# Patient Record
Sex: Male | Born: 1945 | Race: White | Hispanic: No | Marital: Married | Smoking: Former smoker
Health system: Southern US, Community
[De-identification: ages and names within clinical notes are randomized; demographics above are authoritative.]

## PROBLEM LIST (undated history)

## (undated) DIAGNOSIS — F329 Major depressive disorder, single episode, unspecified: Secondary | ICD-10-CM

## (undated) DIAGNOSIS — K219 Gastro-esophageal reflux disease without esophagitis: Secondary | ICD-10-CM

## (undated) DIAGNOSIS — F32A Depression, unspecified: Secondary | ICD-10-CM

## (undated) HISTORY — DX: Gastro-esophageal reflux disease without esophagitis: K21.9

## (undated) HISTORY — DX: Depression, unspecified: F32.A

## (undated) HISTORY — DX: Major depressive disorder, single episode, unspecified: F32.9

## (undated) HISTORY — PX: NO PAST SURGERIES: SHX2092

---

## 2015-12-08 DIAGNOSIS — Z125 Encounter for screening for malignant neoplasm of prostate: Secondary | ICD-10-CM | POA: Diagnosis not present

## 2015-12-08 DIAGNOSIS — R6889 Other general symptoms and signs: Secondary | ICD-10-CM | POA: Diagnosis not present

## 2015-12-08 DIAGNOSIS — R7989 Other specified abnormal findings of blood chemistry: Secondary | ICD-10-CM | POA: Diagnosis not present

## 2015-12-08 DIAGNOSIS — M109 Gout, unspecified: Secondary | ICD-10-CM | POA: Diagnosis not present

## 2015-12-08 DIAGNOSIS — Z0000 Encounter for general adult medical examination without abnormal findings: Secondary | ICD-10-CM | POA: Diagnosis not present

## 2016-09-07 DIAGNOSIS — M19071 Primary osteoarthritis, right ankle and foot: Secondary | ICD-10-CM | POA: Diagnosis not present

## 2017-04-04 DIAGNOSIS — Z13220 Encounter for screening for lipoid disorders: Secondary | ICD-10-CM | POA: Diagnosis not present

## 2017-04-04 DIAGNOSIS — M109 Gout, unspecified: Secondary | ICD-10-CM | POA: Diagnosis not present

## 2017-04-04 DIAGNOSIS — Z23 Encounter for immunization: Secondary | ICD-10-CM | POA: Diagnosis not present

## 2017-04-04 DIAGNOSIS — M19071 Primary osteoarthritis, right ankle and foot: Secondary | ICD-10-CM | POA: Diagnosis not present

## 2017-04-04 DIAGNOSIS — Z1159 Encounter for screening for other viral diseases: Secondary | ICD-10-CM | POA: Diagnosis not present

## 2017-04-04 DIAGNOSIS — Z1211 Encounter for screening for malignant neoplasm of colon: Secondary | ICD-10-CM | POA: Diagnosis not present

## 2017-06-27 DIAGNOSIS — M109 Gout, unspecified: Secondary | ICD-10-CM | POA: Diagnosis not present

## 2017-07-04 DIAGNOSIS — R7309 Other abnormal glucose: Secondary | ICD-10-CM | POA: Diagnosis not present

## 2017-10-10 ENCOUNTER — Telehealth: Payer: Self-pay | Admitting: General Practice

## 2017-10-10 NOTE — Telephone Encounter (Signed)
Received records via fax from [facility] placed 16 pages in [facility] at front under "A"

## 2017-10-19 DIAGNOSIS — H2513 Age-related nuclear cataract, bilateral: Secondary | ICD-10-CM | POA: Diagnosis not present

## 2018-01-01 ENCOUNTER — Encounter: Payer: Self-pay | Admitting: Family Medicine

## 2018-01-01 ENCOUNTER — Ambulatory Visit (INDEPENDENT_AMBULATORY_CARE_PROVIDER_SITE_OTHER): Payer: PPO | Admitting: Family Medicine

## 2018-01-01 VITALS — BP 126/80 | HR 77 | Temp 98.7°F | Ht 65.0 in | Wt 180.0 lb

## 2018-01-01 DIAGNOSIS — J069 Acute upper respiratory infection, unspecified: Secondary | ICD-10-CM | POA: Diagnosis not present

## 2018-01-01 DIAGNOSIS — Z0000 Encounter for general adult medical examination without abnormal findings: Secondary | ICD-10-CM | POA: Diagnosis not present

## 2018-01-01 DIAGNOSIS — Z1211 Encounter for screening for malignant neoplasm of colon: Secondary | ICD-10-CM

## 2018-01-01 DIAGNOSIS — M109 Gout, unspecified: Secondary | ICD-10-CM

## 2018-01-01 HISTORY — DX: Acute upper respiratory infection, unspecified: J06.9

## 2018-01-01 HISTORY — DX: Encounter for screening for malignant neoplasm of colon: Z12.11

## 2018-01-01 HISTORY — DX: Gout, unspecified: M10.9

## 2018-01-01 MED ORDER — ASPIRIN EC 81 MG PO TBEC: 81.0000 mg | DELAYED_RELEASE_TABLET | Freq: Every day | ORAL | 1 refills | Status: DC

## 2018-01-01 MED ORDER — ALLOPURINOL 100 MG PO TABS: 100.0000 mg | ORAL_TABLET | Freq: Every day | ORAL | 0 refills | Status: DC

## 2018-01-01 MED ORDER — AZITHROMYCIN 250 MG PO TABS: ORAL_TABLET | ORAL | 0 refills | Status: DC

## 2018-01-01 NOTE — Progress Notes (Signed)
Subjective:  Patient ID: Gary Case, male    DOB: August 18, 1946  Age: 73 y.o. MRN: [id number]  CC: Establish Care   HPI Billie Intriago presents for establishment for evaluation of a URI.  He is well-known to me.  He has had a cold that seems to be getting better over the last 5 days.  His cough is improving.  There has been no fever or chills.  He has had a stuffy and runny nose with clear rhinorrhea.  He has not been using over-the-counter medicines there is been no wheezing.  He does not smoke or have an asthma history.  He needs a refill on his allopurinol.  His gout is well controlled with allopurinol.  He only uses the Indocin and colchicine on an as-needed basis.  He is never had a colonoscopy and we discussed that as well as his shingles vaccine.  He continues to work as a Scientist, physiological of large gland tracts.  Outpatient Medications Prior to Visit  Medication Sig Dispense Refill  . colchicine 0.6 MG tablet Take 1 tablet by mouth twice daily. Take for 7-10 days as needed for flares.    . indomethacin (INDOCIN) 25 MG capsule Take 25 mg by mouth 3 (three) times daily with meals.    Marland Kitchen allopurinol (ZYLOPRIM) 100 MG tablet Take 1 tablet by mouth daily.     No facility-administered medications prior to visit.     ROS Review of Systems  Constitutional: Negative for chills, fatigue and fever.  HENT: Positive for congestion, postnasal drip and rhinorrhea. Negative for sinus pressure and sinus pain.   Eyes: Negative for photophobia and visual disturbance.  Respiratory: Positive for cough. Negative for chest tightness, shortness of breath and wheezing.   Cardiovascular: Negative.   Gastrointestinal: Negative.   Endocrine: Negative for polyphagia and polyuria.  Musculoskeletal: Negative for arthralgias.  Skin: Negative.  Negative for rash.  Neurological: Negative for weakness and headaches.  Hematological: Negative.   Psychiatric/Behavioral: Negative.      Objective:  BP 126/80 (BP Location: Left Arm, Patient Position: Sitting, Cuff Size: Normal)   Pulse 77   Temp 98.7 F (37.1 C) (Oral)   Ht 5\' 5"  (1.651 m)   Wt 180 lb (81.6 kg)   SpO2 97%   BMI 29.95 kg/m   BP Readings from Last 3 Encounters:  01/01/18 126/80    Wt Readings from Last 3 Encounters:  01/01/18 180 lb (81.6 kg)    Physical Exam  Constitutional: He is oriented to person, place, and time. He appears well-developed and well-nourished. No distress.  HENT:  Head: Normocephalic and atraumatic.  Right Ear: External ear normal.  Left Ear: External ear normal.  Mouth/Throat: Oropharynx is clear and moist. No oropharyngeal exudate.  Eyes: Conjunctivae are normal. Pupils are equal, round, and reactive to light. Right eye exhibits no discharge. Left eye exhibits no discharge. No scleral icterus.  Neck: Neck supple. No JVD present. No tracheal deviation present. No thyromegaly present.  Cardiovascular: Normal rate, regular rhythm and normal heart sounds.  Pulmonary/Chest: Effort normal and breath sounds normal. No stridor. No respiratory distress. He has no wheezes. He has no rales.  Abdominal: Soft. Bowel sounds are normal.  Lymphadenopathy:    He has no cervical adenopathy.  Neurological: He is alert and oriented to person, place, and time.  Skin: Skin is warm and dry. No rash noted. He is not diaphoretic. No erythema.  Psychiatric: He has a normal mood and affect. His behavior is  normal.    No results found for: WBC, HGB, HCT, PLT, GLUCOSE, CHOL, TRIG, HDL, LDLDIRECT, LDLCALC, ALT, AST, NA, K, CL, CREATININE, BUN, CO2, TSH, PSA, INR, GLUF, HGBA1C, MICROALBUR  Patient was never admitted.  Assessment & Plan:   Eliecer was seen today for establish care.  Diagnoses and all orders for this visit:  Viral upper respiratory tract infection -     azithromycin (ZITHROMAX) 250 MG tablet; Please allow to hold.  Take 2 today and then one each day until  finished.  Gout, unspecified cause, unspecified chronicity, unspecified site -     allopurinol (ZYLOPRIM) 100 MG tablet; Take 1 tablet (100 mg total) by mouth daily.  Healthcare maintenance -     aspirin EC 81 MG tablet; Take 1 tablet (81 mg total) by mouth daily.   I have changed Chrissie Noa Dimarco's allopurinol. I am also having him start on azithromycin and aspirin EC. Additionally, I am having him maintain his indomethacin and colchicine.   Patient was given information today on Colo guard.  He will return in May for physical exam.  Refilled his allopurinol today.  He was given give given a prescription for Zithromax to use if his URI and does not continue to improve.  He will follow-up with me in a week if he is not 100% better.  Meds ordered this encounter  Medications  . allopurinol (ZYLOPRIM) 100 MG tablet    Sig: Take 1 tablet (100 mg total) by mouth daily.    Dispense:  100 tablet    Refill:  0  . azithromycin (ZITHROMAX) 250 MG tablet    Sig: Please allow to hold.  Take 2 today and then one each day until finished.    Dispense:  6 tablet    Refill:  0  . aspirin EC 81 MG tablet    Sig: Take 1 tablet (81 mg total) by mouth daily.    Dispense:  365 tablet    Refill:  1     Follow-up: Return in about 1 week (around 01/08/2018), or if symptoms worsen or fail to improve.  [name], MD

## 2018-01-02 ENCOUNTER — Encounter: Payer: Self-pay | Admitting: Family Medicine

## 2018-05-01 ENCOUNTER — Other Ambulatory Visit: Payer: Self-pay

## 2018-05-01 ENCOUNTER — Encounter: Payer: Self-pay | Admitting: Family Medicine

## 2018-05-01 ENCOUNTER — Ambulatory Visit (INDEPENDENT_AMBULATORY_CARE_PROVIDER_SITE_OTHER): Payer: PPO | Admitting: Family Medicine

## 2018-05-01 VITALS — BP 124/78 | HR 64 | Temp 98.7°F | Ht 65.0 in | Wt 181.5 lb

## 2018-05-01 DIAGNOSIS — E7800 Pure hypercholesterolemia, unspecified: Secondary | ICD-10-CM

## 2018-05-01 DIAGNOSIS — S41112A Laceration without foreign body of left upper arm, initial encounter: Secondary | ICD-10-CM | POA: Insufficient documentation

## 2018-05-01 DIAGNOSIS — R739 Hyperglycemia, unspecified: Secondary | ICD-10-CM | POA: Diagnosis not present

## 2018-05-01 DIAGNOSIS — Z23 Encounter for immunization: Secondary | ICD-10-CM

## 2018-05-01 DIAGNOSIS — M109 Gout, unspecified: Secondary | ICD-10-CM

## 2018-05-01 DIAGNOSIS — Z1211 Encounter for screening for malignant neoplasm of colon: Secondary | ICD-10-CM

## 2018-05-01 DIAGNOSIS — Z0000 Encounter for general adult medical examination without abnormal findings: Secondary | ICD-10-CM

## 2018-05-01 HISTORY — DX: Pure hypercholesterolemia, unspecified: E78.00

## 2018-05-01 HISTORY — DX: Laceration without foreign body of left upper arm, initial encounter: S41.112A

## 2018-05-01 HISTORY — DX: Hyperglycemia, unspecified: R73.9

## 2018-05-01 NOTE — Progress Notes (Signed)
Subjective:  Patient ID: Gary Case, male    DOB: 08-19-1946  Age: 72 y.o. MRN: [id number]  CC: Annual Exam   HPI Faruq Rosenberger presents for follow-up of his gout that is well controlled on low-dose allopurinol.  He uses colchicine and indomethacin's as needed for flares.  He has not had a colonoscopy.  He has been given a Cologuard kit but did not follow through with it.  He has been scheduled for colonoscopy some years ago but not did not actually have the exam.  He is having no changes in his bowel movements or blood in his stool.  Reminded him that Medicare may not cover the cost of screening past age 49.  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  His blood sugar has been elevated in the past but he has never been diagnosed with diabetes.  He has no frequent urination or weight loss.  No visual problems.  He does have a healing laceration of his left arm.  His urine flow is good.  He does experience nocturia 1-2 times per night but then he admits drinking fluids just before bedtime.  Outpatient Medications Prior to Visit  Medication Sig Dispense Refill  . aspirin EC 81 MG tablet Take 1 tablet (81 mg total) by mouth daily. 365 tablet 1  . colchicine 0.6 MG tablet Take 1 tablet by mouth twice daily. Take for 7-10 days as needed for flares.    . indomethacin (INDOCIN) 25 MG capsule Take 25 mg by mouth 3 (three) times daily with meals.    Marland Kitchen allopurinol (ZYLOPRIM) 100 MG tablet Take 1 tablet (100 mg total) by mouth daily. 100 tablet 0   No facility-administered medications prior to visit.     ROS Review of Systems  Constitutional: Negative for activity change, appetite change, chills, fatigue, fever and unexpected weight change.  HENT: Negative.   Eyes: Negative for photophobia and visual disturbance.  Respiratory: Negative.  Negative for cough and shortness of breath.   Cardiovascular: Negative for chest pain and palpitations.  Gastrointestinal: Negative for anal  bleeding, blood in stool, constipation and diarrhea.  Endocrine: Negative for polyphagia and polyuria.  Genitourinary: Negative for decreased urine volume, difficulty urinating, frequency and urgency.  Musculoskeletal: Negative for arthralgias and myalgias.  Skin: Negative for color change and pallor.  Allergic/Immunologic: Negative for immunocompromised state.  Neurological: Negative for weakness and headaches.  Hematological: Negative for adenopathy. Does not bruise/bleed easily.  Psychiatric/Behavioral: Negative for dysphoric mood. The patient is not nervous/anxious.     Objective:  BP 124/78   Pulse 64   Temp 98.7 F (37.1 C)   Ht _0  (1.651 m)   Wt 181 lb 8 oz (82.3 kg)   SpO2 98%   BMI 30.20 kg/m   BP Readings from Last 3 Encounters:  05/01/18 124/78  01/01/18 126/80    Wt Readings from Last 3 Encounters:  05/01/18 181 lb 8 oz (82.3 kg)  01/01/18 180 lb (81.6 kg)    Physical Exam  Constitutional: He is oriented to person, place, and time. He appears well-developed and well-nourished. No distress.  HENT:  Head: Normocephalic and atraumatic.  Right Ear: External ear normal.  Left Ear: External ear normal.  Mouth/Throat: Oropharynx is clear and moist. No oropharyngeal exudate.  Eyes: Pupils are equal, round, and reactive to light. Conjunctivae and EOM are normal. Right eye exhibits no discharge. Left eye exhibits no discharge. No scleral icterus.  Neck: Normal range of motion. Neck supple.  No JVD present. No tracheal deviation present. No thyromegaly present.  Cardiovascular: Normal rate, regular rhythm and normal heart sounds.  Pulmonary/Chest: Effort normal and breath sounds normal.  Abdominal: Soft. Bowel sounds are normal. He exhibits no distension and no mass. There is no tenderness. There is no rebound and no guarding.  Genitourinary: Rectum normal. Rectal exam shows no external hemorrhoid, no internal hemorrhoid, no fissure, no mass, no tenderness, anal tone  normal and guaiac negative stool. Prostate is not enlarged and not tender.  Musculoskeletal: He exhibits no edema or tenderness.  Lymphadenopathy:    He has no cervical adenopathy.  Neurological: He is alert and oriented to person, place, and time.  Skin: Skin is warm and dry. Capillary refill takes less than 2 seconds. No rash noted. He is not diaphoretic. No erythema. No pallor.  Psychiatric: He has a normal mood and affect. His behavior is normal.    No results found for: WBC, HGB, HCT, PLT, GLUCOSE, CHOL, TRIG, HDL, LDLDIRECT, LDLCALC, ALT, AST, NA, K, CL, CREATININE, BUN, CO2, TSH, PSA, INR, GLUF, HGBA1C, MICROALBUR  Patient was never admitted.  Assessment & Plan:   Sanuel was seen today for annual exam.  Diagnoses and all orders for this visit:  Gout, unspecified cause, unspecified chronicity, unspecified site -     CBC; Future -     Urinalysis, Routine w reflex microscopic; Future -     Uric acid; Future  Healthcare maintenance -     Comprehensive metabolic panel; Future -     PSA; Future  Laceration of left upper extremity, initial encounter -     Tdap vaccine greater than or equal to 7yo IM  Elevated cholesterol -     CBC; Future -     Comprehensive metabolic panel; Future -     Lipid panel; Future  Screen for colon cancer -     Ambulatory referral to Gastroenterology  Elevated blood sugar -     Comprehensive metabolic panel; Future -     Hemoglobin A1c; Future -     Urinalysis, Routine w reflex microscopic; Future  Need for vaccination against Streptococcus pneumoniae -     Pneumococcal polysaccharide vaccine 23-valent greater than or equal to 2yo subcutaneous/IM   I am having Sherrin Daisy maintain his indomethacin, colchicine, allopurinol, and aspirin EC.  No orders of the defined types were placed in this encounter.  Patient will return fasting in the morning for his blood work.  Routine wound care for his arm laceration.  Encouraged him to  maintain his currently healthy lifestyle that includes walking and riding his bicycle for distances.  Applauded him for finally agreeing to go for colonoscopy.  Follow-up: Return recommended follow up pends lab results.  Libby Maw, MD

## 2018-05-02 ENCOUNTER — Other Ambulatory Visit (INDEPENDENT_AMBULATORY_CARE_PROVIDER_SITE_OTHER): Payer: PPO

## 2018-05-02 DIAGNOSIS — M109 Gout, unspecified: Secondary | ICD-10-CM

## 2018-05-02 DIAGNOSIS — E7800 Pure hypercholesterolemia, unspecified: Secondary | ICD-10-CM

## 2018-05-02 DIAGNOSIS — R739 Hyperglycemia, unspecified: Secondary | ICD-10-CM

## 2018-05-02 DIAGNOSIS — Z0000 Encounter for general adult medical examination without abnormal findings: Secondary | ICD-10-CM | POA: Diagnosis not present

## 2018-05-02 LAB — URINALYSIS, ROUTINE W REFLEX MICROSCOPIC
BILIRUBIN URINE: NEGATIVE
Hgb urine dipstick: NEGATIVE
KETONES UR: NEGATIVE
LEUKOCYTES UA: NEGATIVE
NITRITE: NEGATIVE
Specific Gravity, Urine: 1.01 (ref 1.000–1.030)
Total Protein, Urine: NEGATIVE
UROBILINOGEN UA: 0.2 (ref 0.0–1.0)
Urine Glucose: NEGATIVE
pH: 6 (ref 5.0–8.0)

## 2018-05-02 LAB — COMPREHENSIVE METABOLIC PANEL
ALBUMIN: 4 g/dL (ref 3.5–5.2)
ALK PHOS: 88 U/L (ref 39–117)
ALT: 15 U/L (ref 0–53)
AST: 14 U/L (ref 0–37)
BILIRUBIN TOTAL: 0.8 mg/dL (ref 0.2–1.2)
BUN: 12 mg/dL (ref 6–23)
CO2: 30 mEq/L (ref 19–32)
Calcium: 9.4 mg/dL (ref 8.4–10.5)
Chloride: 103 mEq/L (ref 96–112)
Creatinine, Ser: 0.95 mg/dL (ref 0.40–1.50)
GFR: 82.34 mL/min (ref >60.00)
GLUCOSE: 115 mg/dL — AB (ref 70–99)
Potassium: 4.6 mEq/L (ref 3.5–5.1)
SODIUM: 140 meq/L (ref 135–145)
TOTAL PROTEIN: 6.8 g/dL (ref 6.0–8.3)

## 2018-05-02 LAB — LIPID PANEL
CHOLESTEROL: 167 mg/dL (ref 0–200)
HDL: 45.1 mg/dL (ref >39.00)
LDL Cholesterol: 100 mg/dL — ABNORMAL HIGH (ref 0–99)
NonHDL: 122.33
TRIGLYCERIDES: 113 mg/dL (ref 0.0–149.0)
Total CHOL/HDL Ratio: 4
VLDL: 22.6 mg/dL (ref 0.0–40.0)

## 2018-05-02 LAB — CBC
HEMATOCRIT: 44.6 % (ref 39.0–52.0)
HEMOGLOBIN: 15.2 g/dL (ref 13.0–17.0)
MCHC: 34.2 g/dL (ref 30.0–36.0)
MCV: 90.4 fl (ref 78.0–100.0)
Platelets: 268 10*3/uL (ref 150.0–400.0)
RBC: 4.94 Mil/uL (ref 4.22–5.81)
RDW: 13.6 % (ref 11.5–15.5)
WBC: 8 10*3/uL (ref 4.0–10.5)

## 2018-05-02 LAB — URIC ACID: Uric Acid, Serum: 6.6 mg/dL (ref 4.0–7.8)

## 2018-05-02 LAB — PSA: PSA: 0.91 ng/mL (ref 0.10–4.00)

## 2018-05-02 LAB — HEMOGLOBIN A1C: HEMOGLOBIN A1C: 5.9 % (ref 4.6–6.5)

## 2018-06-05 ENCOUNTER — Encounter: Payer: Self-pay | Admitting: Family Medicine

## 2018-07-10 ENCOUNTER — Telehealth: Payer: Self-pay | Admitting: Family Medicine

## 2018-07-10 ENCOUNTER — Other Ambulatory Visit: Payer: Self-pay | Admitting: *Deleted

## 2018-07-10 DIAGNOSIS — M109 Gout, unspecified: Secondary | ICD-10-CM

## 2018-07-10 MED ORDER — ALLOPURINOL 100 MG PO TABS: 100.0000 mg | ORAL_TABLET | Freq: Every day | ORAL | 2 refills | Status: DC

## 2018-07-10 NOTE — Telephone Encounter (Signed)
Rx refilled per protocol- CPE 05/01/18- labs normal- notes to continue. OK to Rf 1 year

## 2018-07-10 NOTE — Telephone Encounter (Signed)
Copied from CRM [id number]. Topic: Quick Communication - Rx Refill/Question >> Jul 10, 2018 12:51 PM Crist Infante wrote: Medication: allopurinol (ZYLOPRIM) 100 MG tablet Pt states [facility] has sent several faxes to the office for this refill. Pt states he requested on 8/21 and would like you to find out why we did not get.   [facility] [address] [place], [place] - [address] [phone] (Phone) [phone] (Fax)

## 2018-08-16 ENCOUNTER — Other Ambulatory Visit: Payer: Self-pay | Admitting: Family Medicine

## 2018-08-16 MED ORDER — INDOMETHACIN 25 MG PO CAPS: 25.0000 mg | ORAL_CAPSULE | Freq: Three times a day (TID) | ORAL | 0 refills | Status: DC

## 2018-08-16 NOTE — Telephone Encounter (Signed)
Requested Prescriptions  Pending Prescriptions Disp Refills  . indomethacin (INDOCIN) 25 MG capsule 90 capsule 0    Sig: Take 1 capsule (25 mg total) by mouth 3 (three) times daily with meals.     Analgesics:  NSAIDS Passed - 08/16/2018 12:31 PM      Passed - Cr in normal range and within 360 days    Creatinine, Ser  Date Value Ref Range Status  05/02/2018 0.95 0.40 - 1.50 mg/dL Final         Passed - HGB in normal range and within 360 days    Hemoglobin  Date Value Ref Range Status  05/02/2018 15.2 13.0 - 17.0 g/dL Final         Passed - Patient is not pregnant      Passed - Valid encounter within last 12 months    Recent Outpatient Visits          3 months ago Gout, unspecified cause, unspecified chronicity, unspecified site   [facility] [place], Talmadge Coventry, MD   7 months ago Viral upper respiratory tract infection   [facility], Talmadge Coventry, MD

## 2018-08-16 NOTE — Telephone Encounter (Signed)
Copied from CRM [id number]. Topic: Quick Communication - Rx Refill/Question >> Aug 16, 2018 12:24 PM Gerrianne Scale wrote: Medication: indomethacin (INDOCIN) 25 MG capsule  Has the patient contacted their pharmacy? Yes.  Pharmacy called (Agent: If no, request that the patient contact the pharmacy for the refill.) (Agent: If yes, when and what did the pharmacy advise?) they faxed over request twice 08-11-18 and yesterday  Preferred Pharmacy (with phone number or street name): [facility] [address] [place], [place] - [address] [phone] (Phone) [phone] (Fax)    Agent: Please be advised that RX refills may take up to 3 business days. We ask that you follow-up with your pharmacy.

## 2018-12-13 ENCOUNTER — Other Ambulatory Visit: Payer: Self-pay | Admitting: Family Medicine

## 2019-01-25 ENCOUNTER — Telehealth: Payer: Self-pay | Admitting: Family Medicine

## 2019-01-25 NOTE — Telephone Encounter (Signed)
I called and spoke with patient. He is aware that the Cologuard will be ordered. Form filled out & placed on MD's desk to be signed. Will fax once signed. Patient is aware that they will mail him the kit.

## 2019-01-25 NOTE — Telephone Encounter (Signed)
Okay to order cologuard?  

## 2019-01-25 NOTE — Telephone Encounter (Signed)
Copied from CRM [id number]. Topic: General - Other >> Jan 25, 2019 12:55 PM Leafy Ro wrote: Reason for CRM: pt is calling and does not want to have a colonoscopy. Pt would like to have cologuard test. Pt needs to have cologuard before he turns 73 on 03[birth year]2019

## 2019-01-25 NOTE — Telephone Encounter (Signed)
Okay 

## 2019-02-18 LAB — COLOGUARD

## 2019-02-28 DIAGNOSIS — Z1211 Encounter for screening for malignant neoplasm of colon: Secondary | ICD-10-CM | POA: Diagnosis not present

## 2019-02-28 DIAGNOSIS — Z1212 Encounter for screening for malignant neoplasm of rectum: Secondary | ICD-10-CM | POA: Diagnosis not present

## 2019-02-28 LAB — COLOGUARD: Cologuard: NEGATIVE

## 2019-03-05 ENCOUNTER — Encounter: Payer: Self-pay | Admitting: Family Medicine

## 2019-03-05 ENCOUNTER — Telehealth: Payer: Self-pay | Admitting: Family Medicine

## 2019-03-05 NOTE — Telephone Encounter (Signed)
I called and spoke with patient. I informed him that his cologuard results are negative & he verbalized understanding.

## 2019-04-15 ENCOUNTER — Other Ambulatory Visit: Payer: Self-pay | Admitting: Family Medicine

## 2019-04-15 DIAGNOSIS — M109 Gout, unspecified: Secondary | ICD-10-CM

## 2019-04-29 ENCOUNTER — Other Ambulatory Visit: Payer: Self-pay | Admitting: Family Medicine

## 2019-04-29 DIAGNOSIS — M109 Gout, unspecified: Secondary | ICD-10-CM

## 2019-04-29 MED ORDER — ALLOPURINOL 100 MG PO TABS: 100.0000 mg | ORAL_TABLET | Freq: Every day | ORAL | 3 refills | Status: DC

## 2019-04-29 NOTE — Telephone Encounter (Signed)
Rx sent in for 90 tabs with 3 refills.

## 2019-04-29 NOTE — Telephone Encounter (Signed)
Copied from [place] [id number]. Topic: Quick Communication - Rx Refill/Question >> Apr 29, 2019 10:29 AM Virl Axe D wrote: Medication: allopurinol (ZYLOPRIM) 100 MG tablet / Pt stated that the pharmacy told him they did not receive a new rx on 04/15/19. Would like to know if rx can be resent. Also pt is requesting rx be changed to 90 days with refills to cover 1 year because he has had multiple issues every time this rx needs to be filled. Please advise  Has the patient contacted their pharmacy? Yes.   (Agent: If no, request that the patient contact the pharmacy for the refill.) (Agent: If yes, when and what did the pharmacy advise?)  Preferred Pharmacy (with phone number or street name): [facility], [place] [phone] (Phone) [phone] (Fax)    Agent: Please be advised that RX refills may take up to 3 business days. We ask that you follow-up with your pharmacy.

## 2019-06-13 ENCOUNTER — Other Ambulatory Visit: Payer: Self-pay

## 2019-08-02 ENCOUNTER — Other Ambulatory Visit: Payer: Self-pay

## 2019-08-02 DIAGNOSIS — M109 Gout, unspecified: Secondary | ICD-10-CM

## 2019-08-02 MED ORDER — ALLOPURINOL 100 MG PO TABS: 100.0000 mg | ORAL_TABLET | Freq: Every day | ORAL | 3 refills | Status: DC

## 2019-09-03 ENCOUNTER — Other Ambulatory Visit: Payer: Self-pay | Admitting: Family Medicine

## 2019-09-03 ENCOUNTER — Telehealth: Payer: Self-pay

## 2019-09-03 NOTE — Telephone Encounter (Signed)
I called and spoke with pt. He has been notified that his Rx was declined. I advised him that the Rx was sent in this morning. I called and spoke with pharmacy. They are filling the Rx now, pt is aware.

## 2019-09-11 DIAGNOSIS — H2513 Age-related nuclear cataract, bilateral: Secondary | ICD-10-CM | POA: Diagnosis not present

## 2019-10-15 DIAGNOSIS — M5442 Lumbago with sciatica, left side: Secondary | ICD-10-CM | POA: Diagnosis not present

## 2019-10-15 DIAGNOSIS — M9904 Segmental and somatic dysfunction of sacral region: Secondary | ICD-10-CM | POA: Diagnosis not present

## 2019-10-15 DIAGNOSIS — M9903 Segmental and somatic dysfunction of lumbar region: Secondary | ICD-10-CM | POA: Diagnosis not present

## 2019-10-15 DIAGNOSIS — M62830 Muscle spasm of back: Secondary | ICD-10-CM | POA: Diagnosis not present

## 2019-10-21 DIAGNOSIS — M5442 Lumbago with sciatica, left side: Secondary | ICD-10-CM | POA: Diagnosis not present

## 2019-10-21 DIAGNOSIS — M9903 Segmental and somatic dysfunction of lumbar region: Secondary | ICD-10-CM | POA: Diagnosis not present

## 2019-10-21 DIAGNOSIS — M62830 Muscle spasm of back: Secondary | ICD-10-CM | POA: Diagnosis not present

## 2019-10-21 DIAGNOSIS — M9904 Segmental and somatic dysfunction of sacral region: Secondary | ICD-10-CM | POA: Diagnosis not present

## 2019-12-05 ENCOUNTER — Other Ambulatory Visit: Payer: Self-pay | Admitting: Family Medicine

## 2019-12-05 NOTE — Telephone Encounter (Signed)
WK-Is refill appropriate? Plz advise/thx dmf

## 2019-12-06 ENCOUNTER — Telehealth: Payer: Self-pay | Admitting: Family Medicine

## 2019-12-06 NOTE — Telephone Encounter (Signed)
Patient calling and requesting a 90 days supply not 60 days for indomethacin. Please call patient at [phone].

## 2019-12-06 NOTE — Telephone Encounter (Signed)
Spoke with patient regarding medication and refill, pt verbally understood he is currently over due for follow up on medication and he will need to be seen in the office before 90 day supply can be sent. Appointment scheduled for 12/10/19

## 2019-12-10 ENCOUNTER — Ambulatory Visit: Payer: PPO | Admitting: Family Medicine

## 2019-12-11 ENCOUNTER — Other Ambulatory Visit: Payer: Self-pay

## 2019-12-12 ENCOUNTER — Ambulatory Visit (INDEPENDENT_AMBULATORY_CARE_PROVIDER_SITE_OTHER): Payer: PPO | Admitting: Family Medicine

## 2019-12-12 ENCOUNTER — Encounter: Payer: Self-pay | Admitting: Family Medicine

## 2019-12-12 VITALS — BP 122/68 | HR 52 | Temp 97.1°F | Ht 65.0 in | Wt 179.2 lb

## 2019-12-12 DIAGNOSIS — M109 Gout, unspecified: Secondary | ICD-10-CM

## 2019-12-12 DIAGNOSIS — E7800 Pure hypercholesterolemia, unspecified: Secondary | ICD-10-CM

## 2019-12-12 LAB — LDL CHOLESTEROL, DIRECT: Direct LDL: 107 mg/dL

## 2019-12-12 LAB — COMPREHENSIVE METABOLIC PANEL
ALT: 14 U/L (ref 0–53)
AST: 16 U/L (ref 0–37)
Albumin: 4.2 g/dL (ref 3.5–5.2)
Alkaline Phosphatase: 77 U/L (ref 39–117)
BUN: 14 mg/dL (ref 6–23)
CO2: 32 mEq/L (ref 19–32)
Calcium: 9.5 mg/dL (ref 8.4–10.5)
Chloride: 105 mEq/L (ref 96–112)
Creatinine, Ser: 0.96 mg/dL (ref 0.40–1.50)
GFR: 76.21 mL/min (ref >60.00)
Glucose, Bld: 102 mg/dL — ABNORMAL HIGH (ref 70–99)
Potassium: 4.8 mEq/L (ref 3.5–5.1)
Sodium: 140 mEq/L (ref 135–145)
Total Bilirubin: 0.9 mg/dL (ref 0.2–1.2)
Total Protein: 6.6 g/dL (ref 6.0–8.3)

## 2019-12-12 LAB — URINALYSIS, ROUTINE W REFLEX MICROSCOPIC
Bilirubin Urine: NEGATIVE
Hgb urine dipstick: NEGATIVE
Ketones, ur: NEGATIVE
Leukocytes,Ua: NEGATIVE
Nitrite: NEGATIVE
RBC / HPF: NONE SEEN (ref 0–?)
Specific Gravity, Urine: 1.015 (ref 1.000–1.030)
Total Protein, Urine: NEGATIVE
Urine Glucose: NEGATIVE
Urobilinogen, UA: 0.2 (ref 0.0–1.0)
WBC, UA: NONE SEEN (ref 0–?)
pH: 6 (ref 5.0–8.0)

## 2019-12-12 LAB — CBC
HCT: 46.1 % (ref 39.0–52.0)
Hemoglobin: 15.9 g/dL (ref 13.0–17.0)
MCHC: 34.4 g/dL (ref 30.0–36.0)
MCV: 90 fl (ref 78.0–100.0)
Platelets: 247 10*3/uL (ref 150.0–400.0)
RBC: 5.12 Mil/uL (ref 4.22–5.81)
RDW: 13.1 % (ref 11.5–15.5)
WBC: 5.8 10*3/uL (ref 4.0–10.5)

## 2019-12-12 LAB — URIC ACID: Uric Acid, Serum: 5.8 mg/dL (ref 4.0–7.8)

## 2019-12-12 MED ORDER — COLCHICINE 0.6 MG PO TABS: 0.6000 mg | ORAL_TABLET | Freq: Two times a day (BID) | ORAL | 1 refills | Status: DC

## 2019-12-12 MED ORDER — ALLOPURINOL 100 MG PO TABS: 100.0000 mg | ORAL_TABLET | Freq: Every day | ORAL | 3 refills | Status: DC

## 2019-12-12 MED ORDER — INDOMETHACIN 25 MG PO CAPS: ORAL_CAPSULE | ORAL | 1 refills | Status: DC

## 2019-12-12 NOTE — Progress Notes (Signed)
Established Patient Office Visit  Subjective:  Patient ID: Gary Case, male    DOB: 19-Feb-1946  Age: 74 y.o. MRN: [id number]  CC:  Chief Complaint  Patient presents with  . Follow-up    refill/follow up on medications, no concerns     HPI Gary Case presents for follow-up of his gouty arthritis.  Continues to do well with the allopurinol and has had no issues taking it.  Denies any specific gouty flares.  He does have ongoing pain in multiple joints.  This is largely relieved when he takes 1 single 25 mg indomethacin nightly before bed.  He has tried Tylenol before without any specific relief.  Denies ongoing indigestion.  He occasionally takes a warm glass of water when there is indigestion and this is rare today.  He does not smoke.  Cholesterol is mildly elevated.  Past Medical History:  Diagnosis Date  . Depression   . GERD (gastroesophageal reflux disease)     History reviewed. No pertinent surgical history.  Family History  Problem Relation Age of Onset  . Arthritis Mother   . Cancer Mother   . Early death Mother   . Alcohol abuse Father   . Diabetes Father   . Hypertension Father   . Kidney disease Father     Social History   Socioeconomic History  . Marital status: Married    Spouse name: Not on file  . Number of children: Not on file  . Years of education: Not on file  . Highest education level: Not on file  Occupational History  . Not on file  Tobacco Use  . Smoking status: Former Research scientist (life sciences)  . Smokeless tobacco: Never Used  Substance and Sexual Activity  . Alcohol use: No  . Drug use: Not on file  . Sexual activity: Not on file  Other Topics Concern  . Not on file  Social History Narrative  . Not on file   Social Determinants of Health   Financial Resource Strain:   . Difficulty of Paying Living Expenses: Not on file  Food Insecurity:   . Worried About Charity fundraiser in the Last Year: Not on file  . Ran Out of Food in the Last  Year: Not on file  Transportation Needs:   . Lack of Transportation (Medical): Not on file  . Lack of Transportation (Non-Medical): Not on file  Physical Activity:   . Days of Exercise per Week: Not on file  . Minutes of Exercise per Session: Not on file  Stress:   . Feeling of Stress : Not on file  Social Connections:   . Frequency of Communication with Friends and Family: Not on file  . Frequency of Social Gatherings with Friends and Family: Not on file  . Attends Religious Services: Not on file  . Active Member of Clubs or Organizations: Not on file  . Attends Archivist Meetings: Not on file  . Marital Status: Not on file  Intimate Partner Violence:   . Fear of Current or Ex-Partner: Not on file  . Emotionally Abused: Not on file  . Physically Abused: Not on file  . Sexually Abused: Not on file    Outpatient Medications Prior to Visit  Medication Sig Dispense Refill  . aspirin EC 81 MG tablet Take 1 tablet (81 mg total) by mouth daily. 365 tablet 1  . allopurinol (ZYLOPRIM) 100 MG tablet Take 1 tablet (100 mg total) by mouth daily. 90 tablet 3  . indomethacin (  INDOCIN) 25 MG capsule Take 1 capsule (25 mg total) by mouth 3 (three) times daily as needed. 60 capsule 0  . colchicine 0.6 MG tablet Take 1 tablet by mouth twice daily. Take for 7-10 days as needed for flares.     No facility-administered medications prior to visit.    No Known Allergies  ROS Review of Systems  Constitutional: Negative.   HENT: Negative.   Eyes: Negative for photophobia and visual disturbance.  Respiratory: Negative.   Cardiovascular: Negative.   Gastrointestinal: Negative.   Endocrine: Negative for polyphagia.  Genitourinary: Negative for difficulty urinating, frequency and urgency.  Musculoskeletal: Positive for arthralgias.  Skin: Negative for pallor and rash.  Allergic/Immunologic: Negative for immunocompromised state.  Neurological: Negative for light-headedness and numbness.   Hematological: Does not bruise/bleed easily.  Psychiatric/Behavioral: Negative.       Objective:    Physical Exam  Constitutional: He is oriented to person, place, and time. He appears well-developed and well-nourished. No distress.  HENT:  Head: Normocephalic and atraumatic.  Right Ear: External ear normal.  Left Ear: External ear normal.  Eyes: Conjunctivae are normal. Right eye exhibits no discharge. Left eye exhibits no discharge. No scleral icterus.  Neck: No JVD present. No tracheal deviation present. No thyromegaly present.  Cardiovascular: Normal rate, regular rhythm and normal heart sounds.  Pulmonary/Chest: Effort normal and breath sounds normal. No stridor.  Abdominal: Bowel sounds are normal.  Lymphadenopathy:    He has no cervical adenopathy.  Neurological: He is alert and oriented to person, place, and time.  Skin: Skin is warm and dry. He is not diaphoretic.  Psychiatric: He has a normal mood and affect. His behavior is normal.    BP 122/68   Pulse (!) 52   Temp (!) 97.1 F (36.2 C) (Temporal)   Ht 5\' 5"  (1.651 m)   Wt 179 lb 3.2 oz (81.3 kg)   SpO2 99%   BMI 29.82 kg/m  Wt Readings from Last 3 Encounters:  12/12/19 179 lb 3.2 oz (81.3 kg)  05/01/18 181 lb 8 oz (82.3 kg)  01/01/18 180 lb (81.6 kg)     Health Maintenance Due  Topic Date Due  . INFLUENZA VACCINE  06/15/2019    There are no preventive care reminders to display for this patient.  No results found for: TSH Lab Results  Component Value Date   WBC 8.0 05/02/2018   HGB 15.2 05/02/2018   HCT 44.6 05/02/2018   MCV 90.4 05/02/2018   PLT 268.0 05/02/2018   Lab Results  Component Value Date   NA 140 05/02/2018   K 4.6 05/02/2018   CO2 30 05/02/2018   GLUCOSE 115 (H) 05/02/2018   BUN 12 05/02/2018   CREATININE 0.95 05/02/2018   BILITOT 0.8 05/02/2018   ALKPHOS 88 05/02/2018   AST 14 05/02/2018   ALT 15 05/02/2018   PROT 6.8 05/02/2018   ALBUMIN 4.0 05/02/2018   CALCIUM 9.4  05/02/2018   GFR 82.34 05/02/2018   Lab Results  Component Value Date   CHOL 167 05/02/2018   Lab Results  Component Value Date   HDL 45.10 05/02/2018   Lab Results  Component Value Date   LDLCALC 100 (H) 05/02/2018   Lab Results  Component Value Date   TRIG 113.0 05/02/2018   Lab Results  Component Value Date   CHOLHDL 4 05/02/2018   Lab Results  Component Value Date   HGBA1C 5.9 05/02/2018      Assessment & Plan:  Problem List Items Addressed This Visit      Musculoskeletal and Integument   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 allopurinol (ZYLOPRIM) 100 MG tablet    Sig: Take 1 tablet (100 mg total) by mouth daily.    Dispense:  90 tablet    Refill:  3  . colchicine 0.6 MG tablet    Sig: Take 1 tablet (0.6 mg total) by mouth 2 (two) times daily. . Take for 7-10 days as needed for flares.    Dispense:  30 tablet    Refill:  1  . indomethacin (INDOCIN) 25 MG capsule    Sig: Take one nightly    Dispense:  90 capsule    Refill:  1    Follow-up: Return in about 6 months (around 06/10/2020).   We will continue to monitor his renal function.  He will let me know if his indigestion becomes an ongoing daily issue.  He is at low risk for cardiovascular disease. [name], MD

## 2019-12-12 NOTE — Patient Instructions (Signed)
Gout  Gout is a condition that causes painful swelling of the joints. Gout is a type of inflammation of the joints (arthritis). This condition is caused by having too much uric acid in the body. Uric acid is a chemical that forms when the body breaks down substances called purines. Purines are important for building body proteins. When the body has too much uric acid, sharp crystals can form and build up inside the joints. This causes pain and swelling. Gout attacks can happen quickly and may be very painful (acute gout). Over time, the attacks can affect more joints and become more frequent (chronic gout). Gout can also cause uric acid to build up under the skin and inside the kidneys. What are the causes? This condition is caused by too much uric acid in your blood. This can happen because:  Your kidneys do not remove enough uric acid from your blood. This is the most common cause.  Your body makes too much uric acid. This can happen with some cancers and cancer treatments. It can also occur if your body is breaking down too many red blood cells (hemolytic anemia).  You eat too many foods that are high in purines. These foods include organ meats and some seafood. Alcohol, especially beer, is also high in purines. A gout attack may be triggered by trauma or stress. What increases the risk? You are more likely to develop this condition if you:  Have a family history of gout.  Are male and middle-aged.  Are male and have gone through menopause.  Are obese.  Frequently drink alcohol, especially beer.  Are dehydrated.  Lose weight too quickly.  Have an organ transplant.  Have lead poisoning.  Take certain medicines, including aspirin, cyclosporine, diuretics, levodopa, and niacin.  Have kidney disease.  Have a skin condition called psoriasis. What are the signs or symptoms? An attack of acute gout happens quickly. It usually occurs in just one joint. The most common place is  the big toe. Attacks often start at night. Other joints that may be affected include joints of the feet, ankle, knee, fingers, wrist, or elbow. Symptoms of this condition may include:  Severe pain.  Warmth.  Swelling.  Stiffness.  Tenderness. The affected joint may be very painful to touch.  Shiny, red, or purple skin.  Chills and fever. Chronic gout may cause symptoms more frequently. More joints may be involved. You may also have white or yellow lumps (tophi) on your hands or feet or in other areas near your joints. How is this diagnosed? This condition is diagnosed based on your symptoms, medical history, and physical exam. You may have tests, such as:  Blood tests to measure uric acid levels.  Removal of joint fluid with a thin needle (aspiration) to look for uric acid crystals.  X-rays to look for joint damage. How is this treated? Treatment for this condition has two phases: treating an acute attack and preventing future attacks. Acute gout treatment may include medicines to reduce pain and swelling, including:  NSAIDs.  Steroids. These are strong anti-inflammatory medicines that can be taken by mouth (orally) or injected into a joint.  Colchicine. This medicine relieves pain and swelling when it is taken soon after an attack. It can be given by mouth or through an IV. Preventive treatment may include:  Daily use of smaller doses of NSAIDs or colchicine.  Use of a medicine that reduces uric acid levels in your blood.  Changes to your diet. You may   need to see a dietitian about what to eat and drink to prevent gout. Follow these instructions at home: During a gout attack   If directed, put ice on the affected area: ? Put ice in a plastic bag. ? Place a towel between your skin and the bag. ? Leave the ice on for 20 minutes, 2-3 times a day.  Raise (elevate) the affected joint above the level of your heart as often as possible.  Rest the joint as much as possible.  If the affected joint is in your leg, you may be given crutches to use.  Follow instructions from your health care provider about eating or drinking restrictions. Avoiding future gout attacks  Follow a low-purine diet as told by your dietitian or health care provider. Avoid foods and drinks that are high in purines, including liver, kidney, anchovies, asparagus, herring, mushrooms, mussels, and beer.  Maintain a healthy weight or lose weight if you are overweight. If you want to lose weight, talk with your health care provider. It is important that you do not lose weight too quickly.  Start or maintain an exercise program as told by your health care provider. Eating and drinking  Drink enough fluids to keep your urine pale yellow.  If you drink alcohol: ? Limit how much you use to:  0-1 drink a day for women.  0-2 drinks a day for men. ? Be aware of how much alcohol is in your drink. In the [place] one drink equals one 12 oz bottle of beer (355 mL) one 5 oz glass of wine (148 mL), or one 1 oz glass of hard liquor (44 mL). General instructions  Take over-the-counter and prescription medicines only as told by your health care provider.  Do not drive or use heavy machinery while taking prescription pain medicine.  Return to your normal activities as told by your health care provider. Ask your health care provider what activities are safe for you.  Keep all follow-up visits as told by your health care provider. This is important. Contact a health care provider if you have:  Another gout attack.  Continuing symptoms of a gout attack after 10 days of treatment.  Side effects from your medicines.  Chills or a fever.  Burning pain when you urinate.  Pain in your lower back or belly. Get help right away if you:  Have severe or uncontrolled pain.  Cannot urinate. Summary  Gout is painful swelling of the joints caused by inflammation.  The most common site of pain is the big  toe, but it can affect other joints in the body.  Medicines and dietary changes can help to prevent and treat gout attacks. This information is not intended to replace advice given to you by your health care provider. Make sure you discuss any questions you have with your health care provider. Document Revised: 05/23/2018 Document Reviewed: 05/23/2018 Elsevier Patient Education  2020 [employer]  

## 2020-03-02 DIAGNOSIS — M62830 Muscle spasm of back: Secondary | ICD-10-CM | POA: Diagnosis not present

## 2020-03-02 DIAGNOSIS — M9903 Segmental and somatic dysfunction of lumbar region: Secondary | ICD-10-CM | POA: Diagnosis not present

## 2020-03-02 DIAGNOSIS — M9904 Segmental and somatic dysfunction of sacral region: Secondary | ICD-10-CM | POA: Diagnosis not present

## 2020-03-02 DIAGNOSIS — M5442 Lumbago with sciatica, left side: Secondary | ICD-10-CM | POA: Diagnosis not present

## 2020-03-05 DIAGNOSIS — M5442 Lumbago with sciatica, left side: Secondary | ICD-10-CM | POA: Diagnosis not present

## 2020-03-05 DIAGNOSIS — M9904 Segmental and somatic dysfunction of sacral region: Secondary | ICD-10-CM | POA: Diagnosis not present

## 2020-03-05 DIAGNOSIS — M62830 Muscle spasm of back: Secondary | ICD-10-CM | POA: Diagnosis not present

## 2020-03-05 DIAGNOSIS — M9903 Segmental and somatic dysfunction of lumbar region: Secondary | ICD-10-CM | POA: Diagnosis not present

## 2020-03-12 DIAGNOSIS — M5442 Lumbago with sciatica, left side: Secondary | ICD-10-CM | POA: Diagnosis not present

## 2020-03-12 DIAGNOSIS — M62830 Muscle spasm of back: Secondary | ICD-10-CM | POA: Diagnosis not present

## 2020-03-12 DIAGNOSIS — M9904 Segmental and somatic dysfunction of sacral region: Secondary | ICD-10-CM | POA: Diagnosis not present

## 2020-03-12 DIAGNOSIS — M9903 Segmental and somatic dysfunction of lumbar region: Secondary | ICD-10-CM | POA: Diagnosis not present

## 2020-06-20 ENCOUNTER — Other Ambulatory Visit: Payer: Self-pay | Admitting: Family Medicine

## 2020-06-20 DIAGNOSIS — M109 Gout, unspecified: Secondary | ICD-10-CM

## 2020-06-22 DIAGNOSIS — M5442 Lumbago with sciatica, left side: Secondary | ICD-10-CM | POA: Diagnosis not present

## 2020-06-22 DIAGNOSIS — M9903 Segmental and somatic dysfunction of lumbar region: Secondary | ICD-10-CM | POA: Diagnosis not present

## 2020-06-22 DIAGNOSIS — M9904 Segmental and somatic dysfunction of sacral region: Secondary | ICD-10-CM | POA: Diagnosis not present

## 2020-06-22 DIAGNOSIS — M62830 Muscle spasm of back: Secondary | ICD-10-CM | POA: Diagnosis not present

## 2020-07-25 DIAGNOSIS — I10 Essential (primary) hypertension: Secondary | ICD-10-CM | POA: Diagnosis not present

## 2020-07-25 DIAGNOSIS — R000 Tachycardia, unspecified: Secondary | ICD-10-CM | POA: Diagnosis not present

## 2020-07-25 DIAGNOSIS — I491 Atrial premature depolarization: Secondary | ICD-10-CM | POA: Diagnosis not present

## 2020-07-25 DIAGNOSIS — R001 Bradycardia, unspecified: Secondary | ICD-10-CM | POA: Diagnosis not present

## 2020-08-03 ENCOUNTER — Ambulatory Visit (INDEPENDENT_AMBULATORY_CARE_PROVIDER_SITE_OTHER): Payer: PPO | Admitting: Family Medicine

## 2020-08-03 ENCOUNTER — Encounter: Payer: Self-pay | Admitting: Family Medicine

## 2020-08-03 ENCOUNTER — Other Ambulatory Visit: Payer: Self-pay

## 2020-08-03 ENCOUNTER — Ambulatory Visit (INDEPENDENT_AMBULATORY_CARE_PROVIDER_SITE_OTHER): Payer: PPO

## 2020-08-03 VITALS — BP 124/76 | HR 57 | Temp 97.4°F | Ht 65.0 in | Wt 176.2 lb

## 2020-08-03 DIAGNOSIS — K219 Gastro-esophageal reflux disease without esophagitis: Secondary | ICD-10-CM

## 2020-08-03 DIAGNOSIS — E7800 Pure hypercholesterolemia, unspecified: Secondary | ICD-10-CM

## 2020-08-03 DIAGNOSIS — R0789 Other chest pain: Secondary | ICD-10-CM

## 2020-08-03 DIAGNOSIS — R05 Cough: Secondary | ICD-10-CM | POA: Diagnosis not present

## 2020-08-03 DIAGNOSIS — M109 Gout, unspecified: Secondary | ICD-10-CM

## 2020-08-03 DIAGNOSIS — R0602 Shortness of breath: Secondary | ICD-10-CM | POA: Diagnosis not present

## 2020-08-03 DIAGNOSIS — K2900 Acute gastritis without bleeding: Secondary | ICD-10-CM

## 2020-08-03 HISTORY — DX: Acute gastritis without bleeding: K29.00

## 2020-08-03 HISTORY — DX: Gastro-esophageal reflux disease without esophagitis: K21.9

## 2020-08-03 MED ORDER — PANTOPRAZOLE SODIUM 40 MG PO TBEC: 40.0000 mg | DELAYED_RELEASE_TABLET | Freq: Every day | ORAL | 3 refills | Status: DC

## 2020-08-03 MED ORDER — MELOXICAM 7.5 MG PO TABS: ORAL_TABLET | ORAL | 0 refills | Status: DC

## 2020-08-03 NOTE — Addendum Note (Signed)
Addended by: Varney Biles on: 08/03/2020 03:22 PM   Modules accepted: Orders

## 2020-08-03 NOTE — Progress Notes (Addendum)
Established Patient Office Visit  Subjective:  Patient ID: Gary Case, male    DOB: Apr 26, 1946  Age: 74 y.o. MRN: [id number]  CC:  Chief Complaint  Patient presents with  . Hospitalization Follow-up    acid reflux on 07/25/20 went to ED but left w/o being seen.    HPI Gary Case presents for follow-up of severe episode of indigestion accompanied by nausea diaphoresis shortness of breath and chest pain.  He was taken to emergency room by EMS but decided not to wait for treatment.  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  He also takes allopurinol and colchicine on a as needed basis.  Last uric acid was 5.8.  He has had a cough productive of occasional phlegm without fever or chills.  He denies hematuria, hematochezia or dark tarry stools.  He usually treats his indigestion with warm sip of water.  Last LDL cholesterol was 102.  He quit smoking 30 or 40 years ago.   Past Medical History:  Diagnosis Date  . Depression   . GERD (gastroesophageal reflux disease)     History reviewed. No pertinent surgical history.  Family History  Problem Relation Age of Onset  . Arthritis Mother   . Cancer Mother   . Early death Mother   . Alcohol abuse Father   . Diabetes Father   . Hypertension Father   . Kidney disease Father     Social History   Socioeconomic History  . Marital status: Married    Spouse name: Not on file  . Number of children: Not on file  . Years of education: Not on file  . Highest education level: Not on file  Occupational History  . Not on file  Tobacco Use  . Smoking status: Former Games developer  . Smokeless tobacco: Never Used  Substance and Sexual Activity  . Alcohol use: No  . Drug use: Not on file  . Sexual activity: Not on file  Other Topics Concern  . Not on file  Social History Narrative  . Not on file   Social Determinants of Health   Financial  Resource Strain:   . Difficulty of Paying Living Expenses: Not on file  Food Insecurity:   . Worried About Programme researcher, broadcasting/film/video in the Last Year: Not on file  . Ran Out of Food in the Last Year: Not on file  Transportation Needs:   . Lack of Transportation (Medical): Not on file  . Lack of Transportation (Non-Medical): Not on file  Physical Activity:   . Days of Exercise per Week: Not on file  . Minutes of Exercise per Session: Not on file  Stress:   . Feeling of Stress : Not on file  Social Connections:   . Frequency of Communication with Friends and Family: Not on file  . Frequency of Social Gatherings with Friends and Family: Not on file  . Attends Religious Services: Not on file  . Active Member of Clubs or Organizations: Not on file  . Attends Banker Meetings: Not on file  . Marital Status: Not on file  Intimate Partner Violence:   . Fear of Current or Ex-Partner: Not on file  . Emotionally Abused: Not on file  . Physically Abused: Not on file  . Sexually Abused: Not on file    Outpatient Medications Prior to Visit  Medication Sig Dispense Refill  . aspirin EC 81  MG tablet Take 1 tablet (81 mg total) by mouth daily. 365 tablet 1  . allopurinol (ZYLOPRIM) 100 MG tablet Take 1 tablet (100 mg total) by mouth daily. 90 tablet 3  . indomethacin (INDOCIN) 25 MG capsule TAKE 1 CAPSULE BY MOUTH AT NIGHT AS NEEDED 90 capsule 0  . colchicine 0.6 MG tablet Take 1 tablet (0.6 mg total) by mouth 2 (two) times daily. . Take for 7-10 days as needed for flares. (Patient not taking: Reported on 08/03/2020) 30 tablet 1   No facility-administered medications prior to visit.    No Known Allergies  ROS Review of Systems  Constitutional: Negative.  Negative for diaphoresis.  HENT: Negative.   Eyes: Negative for photophobia and visual disturbance.  Respiratory: Positive for cough. Negative for chest tightness, shortness of breath and wheezing.   Cardiovascular: Negative for  chest pain, palpitations and leg swelling.  Gastrointestinal: Negative for abdominal pain, anal bleeding, blood in stool, nausea and vomiting.  Endocrine: Negative for polyphagia and polyuria.  Genitourinary: Negative for flank pain, hematuria and urgency.  Musculoskeletal: Positive for arthralgias.  Allergic/Immunologic: Negative for immunocompromised state.  Neurological: Negative for tremors, speech difficulty, weakness and light-headedness.  Hematological: Does not bruise/bleed easily.  Psychiatric/Behavioral: Negative.       Objective:    Physical Exam Vitals and nursing note reviewed.  Constitutional:      General: He is not in acute distress.    Appearance: Normal appearance. He is not ill-appearing, toxic-appearing or diaphoretic.  HENT:     Head: Normocephalic and atraumatic.     Right Ear: External ear normal.     Left Ear: External ear normal.  Eyes:     General: No scleral icterus.       Right eye: No discharge.        Left eye: No discharge.     Conjunctiva/sclera: Conjunctivae normal.     Pupils: Pupils are equal, round, and reactive to light.  Cardiovascular:     Rate and Rhythm: Normal rate and regular rhythm.  Pulmonary:     Effort: Pulmonary effort is normal.     Breath sounds: Normal breath sounds.  Abdominal:     General: Abdomen is flat. Bowel sounds are normal. There is no distension.     Palpations: Abdomen is soft. There is no mass.     Tenderness: There is no abdominal tenderness. There is no guarding or rebound.     Hernia: No hernia is present.  Musculoskeletal:     Cervical back: Normal range of motion and neck supple. No rigidity or tenderness.     Right lower leg: No edema.     Left lower leg: No edema.  Lymphadenopathy:     Cervical: No cervical adenopathy.  Neurological:     General: No focal deficit present.     Mental Status: He is alert and oriented to person, place, and time.  Psychiatric:        Mood and Affect: Mood normal.         Behavior: Behavior normal.   The 10-year ASCVD risk score Denman George DC Jr., et al., 2013) is: 24.4%   Values used to calculate the score:     Age: 41 years     Sex: Male     Is Non-Hispanic African American: No     Diabetic: No     Tobacco smoker: No     Systolic Blood Pressure: 124 mmHg     Is BP treated: No  HDL Cholesterol: 44.6 mg/dL     Total Cholesterol: 161 mg/dL  BP 979/89   Pulse (!) 57   Temp (!) 97.4 F (36.3 C) (Temporal)   Ht 5\' 5"  (1.651 m)   Wt 176 lb 3.2 oz (79.9 kg)   SpO2 97%   BMI 29.32 kg/m  Wt Readings from Last 3 Encounters:  08/03/20 176 lb 3.2 oz (79.9 kg)  12/12/19 179 lb 3.2 oz (81.3 kg)  05/01/18 181 lb 8 oz (82.3 kg)     Health Maintenance Due  Topic Date Due  . COVID-19 Vaccine (1) Never done  . INFLUENZA VACCINE  Never done    There are no preventive care reminders to display for this patient.  No results found for: TSH Lab Results  Component Value Date   WBC 6.3 08/05/2020   HGB 15.4 08/05/2020   HCT 44.6 08/05/2020   MCV 90.4 08/05/2020   PLT 245.0 08/05/2020   Lab Results  Component Value Date   NA 142 08/05/2020   K 5.1 08/05/2020   CO2 31 08/05/2020   GLUCOSE 98 08/05/2020   BUN 14 08/05/2020   CREATININE 0.96 08/05/2020   BILITOT 0.8 08/05/2020   ALKPHOS 77 08/05/2020   AST 14 08/05/2020   ALT 13 08/05/2020   PROT 6.7 08/05/2020   ALBUMIN 4.0 08/05/2020   CALCIUM 9.3 08/05/2020   GFR 76.08 08/05/2020   Lab Results  Component Value Date   CHOL 161 08/05/2020   Lab Results  Component Value Date   HDL 44.60 08/05/2020   Lab Results  Component Value Date   LDLCALC 97 08/05/2020   Lab Results  Component Value Date   TRIG 98.0 08/05/2020   Lab Results  Component Value Date   CHOLHDL 4 08/05/2020   Lab Results  Component Value Date   HGBA1C 5.9 05/02/2018      Assessment & Plan:   Problem List Items Addressed This Visit      Digestive   Gastroesophageal reflux disease   Relevant Medications    pantoprazole (PROTONIX) 40 MG tablet   Other Relevant Orders   Ambulatory referral to Gastroenterology   DG Chest 2 View (Completed)   Acute gastritis   Relevant Medications   pantoprazole (PROTONIX) 40 MG tablet   Other Relevant Orders   Ambulatory referral to Gastroenterology   DG Chest 2 View (Completed)     Other   Gout - Primary   Relevant Medications   meloxicam (MOBIC) 7.5 MG tablet   allopurinol (ZYLOPRIM) 100 MG tablet   colchicine 0.6 MG tablet   Other Relevant Orders   Uric acid (Completed)   Urinalysis, Routine w reflex microscopic (Completed)   CBC (Completed)   Comprehensive metabolic panel (Completed)   Elevated cholesterol   Relevant Orders   Comprehensive metabolic panel (Completed)   Lipid panel (Completed)    Other Visit Diagnoses    Other chest pain       Relevant Orders   Ambulatory referral to Cardiology      Meds ordered this encounter  Medications  . pantoprazole (PROTONIX) 40 MG tablet    Sig: Take 1 tablet (40 mg total) by mouth daily.    Dispense:  30 tablet    Refill:  3  . meloxicam (MOBIC) 7.5 MG tablet    Sig: Take one with food daily ONLY AS NEEDED.    Dispense:  30 tablet    Refill:  0  . allopurinol (ZYLOPRIM) 100 MG tablet  Sig: Take 2 tablets (200 mg total) by mouth daily.    Dispense:  180 tablet    Refill:  1  . colchicine 0.6 MG tablet    Sig: Take 1 tablet (0.6 mg total) by mouth daily.    Dispense:  90 tablet    Refill:  0    Follow-up: Return in about 6 weeks (around 09/14/2020), or RETURN FASTING FOR BLOODWORK..   Have increased allopurinol to 200 mg daily and will take colchicine daily with this for the first 3 months. [name], MD

## 2020-08-04 ENCOUNTER — Encounter: Payer: Self-pay | Admitting: Gastroenterology

## 2020-08-05 ENCOUNTER — Other Ambulatory Visit: Payer: Self-pay

## 2020-08-05 ENCOUNTER — Other Ambulatory Visit (INDEPENDENT_AMBULATORY_CARE_PROVIDER_SITE_OTHER): Payer: PPO

## 2020-08-05 DIAGNOSIS — E7800 Pure hypercholesterolemia, unspecified: Secondary | ICD-10-CM

## 2020-08-05 DIAGNOSIS — M109 Gout, unspecified: Secondary | ICD-10-CM

## 2020-08-05 LAB — LIPID PANEL
Cholesterol: 161 mg/dL (ref 0–200)
HDL: 44.6 mg/dL (ref >39.00)
LDL Cholesterol: 97 mg/dL (ref 0–99)
NonHDL: 116.16
Total CHOL/HDL Ratio: 4
Triglycerides: 98 mg/dL (ref 0.0–149.0)
VLDL: 19.6 mg/dL (ref 0.0–40.0)

## 2020-08-05 LAB — URINALYSIS, ROUTINE W REFLEX MICROSCOPIC
Bilirubin Urine: NEGATIVE
Hgb urine dipstick: NEGATIVE
Ketones, ur: NEGATIVE
Leukocytes,Ua: NEGATIVE
Nitrite: NEGATIVE
RBC / HPF: NONE SEEN (ref 0–?)
Specific Gravity, Urine: 1.02 (ref 1.000–1.030)
Total Protein, Urine: NEGATIVE
Urine Glucose: NEGATIVE
Urobilinogen, UA: 0.2 (ref 0.0–1.0)
pH: 6 (ref 5.0–8.0)

## 2020-08-05 LAB — CBC
HCT: 44.6 % (ref 39.0–52.0)
Hemoglobin: 15.4 g/dL (ref 13.0–17.0)
MCHC: 34.4 g/dL (ref 30.0–36.0)
MCV: 90.4 fl (ref 78.0–100.0)
Platelets: 245 10*3/uL (ref 150.0–400.0)
RBC: 4.93 Mil/uL (ref 4.22–5.81)
RDW: 13.2 % (ref 11.5–15.5)
WBC: 6.3 10*3/uL (ref 4.0–10.5)

## 2020-08-05 LAB — COMPREHENSIVE METABOLIC PANEL
ALT: 13 U/L (ref 0–53)
AST: 14 U/L (ref 0–37)
Albumin: 4 g/dL (ref 3.5–5.2)
Alkaline Phosphatase: 77 U/L (ref 39–117)
BUN: 14 mg/dL (ref 6–23)
CO2: 31 mEq/L (ref 19–32)
Calcium: 9.3 mg/dL (ref 8.4–10.5)
Chloride: 106 mEq/L (ref 96–112)
Creatinine, Ser: 0.96 mg/dL (ref 0.40–1.50)
GFR: 76.08 mL/min (ref >60.00)
Glucose, Bld: 98 mg/dL (ref 70–99)
Potassium: 5.1 mEq/L (ref 3.5–5.1)
Sodium: 142 mEq/L (ref 135–145)
Total Bilirubin: 0.8 mg/dL (ref 0.2–1.2)
Total Protein: 6.7 g/dL (ref 6.0–8.3)

## 2020-08-05 LAB — URIC ACID: Uric Acid, Serum: 6 mg/dL (ref 4.0–7.8)

## 2020-08-06 MED ORDER — ALLOPURINOL 100 MG PO TABS: 200.0000 mg | ORAL_TABLET | Freq: Every day | ORAL | 1 refills | Status: DC

## 2020-08-06 MED ORDER — COLCHICINE 0.6 MG PO TABS: 0.6000 mg | ORAL_TABLET | Freq: Every day | ORAL | 0 refills | Status: DC

## 2020-08-06 NOTE — Addendum Note (Signed)
Addended by: Andrez Grime on: 08/06/2020 07:56 AM   Modules accepted: Orders

## 2020-08-24 ENCOUNTER — Other Ambulatory Visit: Payer: Self-pay

## 2020-08-24 DIAGNOSIS — K219 Gastro-esophageal reflux disease without esophagitis: Secondary | ICD-10-CM | POA: Insufficient documentation

## 2020-08-24 DIAGNOSIS — F32A Depression, unspecified: Secondary | ICD-10-CM | POA: Insufficient documentation

## 2020-08-25 ENCOUNTER — Other Ambulatory Visit: Payer: Self-pay

## 2020-08-25 ENCOUNTER — Ambulatory Visit: Payer: PPO | Admitting: Cardiology

## 2020-08-25 ENCOUNTER — Encounter: Payer: Self-pay | Admitting: Cardiology

## 2020-08-25 DIAGNOSIS — R0609 Other forms of dyspnea: Secondary | ICD-10-CM | POA: Insufficient documentation

## 2020-08-25 DIAGNOSIS — R0789 Other chest pain: Secondary | ICD-10-CM | POA: Diagnosis not present

## 2020-08-25 DIAGNOSIS — R0600 Dyspnea, unspecified: Secondary | ICD-10-CM

## 2020-08-25 NOTE — Progress Notes (Signed)
Cardiology Office Note:    Date:  08/25/2020   ID:  Gary Case, DOB 17-Jun-1946, MRN [id number]  PCP:  [name], MD  Cardiologist:  Garwin Brothers, MD   Referring MD: [name],*    ASSESSMENT:    1. Chest discomfort   2. DOE (dyspnea on exertion)    PLAN:    In order of problems listed above:  1. Primary prevention stressed with the patient. Importance of compliance with diet medication stressed any vocalized understanding. He has excellent effort tolerance and I encouraged him to continue his exercise. I reviewed his lipids and they are fine also. 2. Chest discomfort: Patient denies chest pain. He had an episode of what he feels is gastritis and acidity. I reassured him about my findings today. 3. Dyspnea: Again this appears to be fairly benign in view of his good effort tolerance of medical management. He is not keen on any further evaluation on this and I agree with his wishes. 4. Cardiac murmur: Echocardiogram will be done to assess murmur on auscultation. 5. Coronary risk stratification: Calcium CT score was at twice to the patient and benefits were explained and is agreeable. 6. Patient will be seen in follow-up appointment in 6 months or earlier if the patient has any concerns    Medication Adjustments/Labs and Tests Ordered: Current medicines are reviewed at length with the patient today.  Concerns regarding medicines are outlined above.  No orders of the defined types were placed in this encounter.  No orders of the defined types were placed in this encounter.    History of Present Illness:    Gary Case is a 74 y.o. male who is being seen today for the evaluation of chest discomfort at the request of Julias, Mould,*. Patient is a pleasant 74 year old male. He has past medical history that is not much significant. He was at a social gathering and developed acidity he refers to this. He says it was more like a  gastritis situation. He denies chest pain. Some chest discomfort. He had excessive salivation at that time. He is an active gentleman and walks about 2 miles on a regular basis without any symptoms. He has some shortness of breath at times. At the time of my evaluation, the patient is alert awake oriented and in no distress.  Past Medical History:  Diagnosis Date  . Acute gastritis 08/03/2020  . Depression   . Elevated blood sugar 05/01/2018  . Elevated cholesterol 05/01/2018  . Gastroesophageal reflux disease 08/03/2020  . GERD (gastroesophageal reflux disease)   . Gout 01/01/2018  . Laceration of left upper extremity 05/01/2018  . Screen for colon cancer 01/01/2018  . Viral upper respiratory tract infection 01/01/2018    Past Surgical History:  Procedure Laterality Date  . NO PAST SURGERIES      Current Medications: Current Meds  Medication Sig  . allopurinol (ZYLOPRIM) 100 MG tablet Take 2 tablets (200 mg total) by mouth daily.  Marland Kitchen aspirin EC 81 MG tablet Take 1 tablet (81 mg total) by mouth daily.  . colchicine 0.6 MG tablet Take 1 tablet (0.6 mg total) by mouth daily.  . meloxicam (MOBIC) 7.5 MG tablet Take one with food daily ONLY AS NEEDED.  Marland Kitchen pantoprazole (PROTONIX) 40 MG tablet Take 1 tablet (40 mg total) by mouth daily.     Allergies:   Patient has no known allergies.   Social History   Socioeconomic History  . Marital status: Married  Spouse name: Not on file  . Number of children: Not on file  . Years of education: Not on file  . Highest education level: Not on file  Occupational History  . Not on file  Tobacco Use  . Smoking status: Former Games developer  . Smokeless tobacco: Never Used  Substance and Sexual Activity  . Alcohol use: No  . Drug use: Not on file  . Sexual activity: Not on file  Other Topics Concern  . Not on file  Social History Narrative  . Not on file   Social Determinants of Health   Financial Resource Strain:   . Difficulty of Paying Living  Expenses: Not on file  Food Insecurity:   . Worried About Programme researcher, broadcasting/film/video in the Last Year: Not on file  . Ran Out of Food in the Last Year: Not on file  Transportation Needs:   . Lack of Transportation (Medical): Not on file  . Lack of Transportation (Non-Medical): Not on file  Physical Activity:   . Days of Exercise per Week: Not on file  . Minutes of Exercise per Session: Not on file  Stress:   . Feeling of Stress : Not on file  Social Connections:   . Frequency of Communication with Friends and Family: Not on file  . Frequency of Social Gatherings with Friends and Family: Not on file  . Attends Religious Services: Not on file  . Active Member of Clubs or Organizations: Not on file  . Attends Banker Meetings: Not on file  . Marital Status: Not on file     Family History: The patient's family history includes Alcohol abuse in his father; Arthritis in his mother; Cancer in his mother; Diabetes in his father; Early death in his mother; Hypertension in his father; Kidney disease in his father.  ROS:   Please see the history of present illness.    All other systems reviewed and are negative.  EKGs/Labs/Other Studies Reviewed:    The following studies were reviewed today: EKG reveals sinus rhythm and nonspecific ST-T changes.   Recent Labs: 08/05/2020: ALT 13; BUN 14; Creatinine, Ser 0.96; Hemoglobin 15.4; Platelets 245.0; Potassium 5.1; Sodium 142  Recent Lipid Panel    Component Value Date/Time   CHOL 161 08/05/2020 0812   TRIG 98.0 08/05/2020 0812   HDL 44.60 08/05/2020 0812   CHOLHDL 4 08/05/2020 0812   VLDL 19.6 08/05/2020 0812   LDLCALC 97 08/05/2020 0812   LDLDIRECT 107.0 12/12/2019 1036    Physical Exam:    VS:  BP 128/78   Pulse (!) 55   Ht 5\' 7"  (1.702 m)   Wt 178 lb (80.7 kg)   SpO2 98%   BMI 27.88 kg/m     Wt Readings from Last 3 Encounters:  08/25/20 178 lb (80.7 kg)  08/03/20 176 lb 3.2 oz (79.9 kg)  12/12/19 179 lb 3.2 oz (81.3  kg)     GEN: Patient is in no acute distress HEENT: Normal NECK: No JVD; No carotid bruits LYMPHATICS: No lymphadenopathy CARDIAC: S1 S2 regular, 2/6 systolic murmur at the apex. RESPIRATORY:  Clear to auscultation without rales, wheezing or rhonchi  ABDOMEN: Soft, non-tender, non-distended MUSCULOSKELETAL:  No edema; No deformity  SKIN: Warm and dry NEUROLOGIC:  Alert and oriented x 3 PSYCHIATRIC:  Normal affect    Signed, 12/14/19, MD  08/25/2020 11:36 AM    [facility]

## 2020-08-25 NOTE — Patient Instructions (Signed)
Medication Instructions:  No medication changes. *If you need a refill on your cardiac medications before your next appointment, please call your pharmacy*   Lab Work: None ordered If you have labs (blood work) drawn today and your tests are completely normal, you will receive your results only by: Marland Kitchen MyChart Message (if you have MyChart) OR . A paper copy in the mail If you have any lab test that is abnormal or we need to change your treatment, we will call you to review the results.   Testing/Procedures: Your physician has requested that you have an echocardiogram. Echocardiography is a painless test that uses sound waves to create images of your heart. It provides your doctor with information about the size and shape of your heart and how well your heart's chambers and valves are working. This procedure takes approximately one hour. There are no restrictions for this procedure.  We will order CT coronary calcium score $150  Please call [phone] to schedule   [facility]  [address]  [place], [place] [address]    Follow-Up: At [facility], you and your health needs are our priority.  As part of our continuing mission to provide you with exceptional heart care, we have created designated Provider Care Teams.  These Care Teams include your primary Cardiologist (physician) and Advanced Practice Providers (APPs -  Physician Assistants and Nurse Practitioners) who all work together to provide you with the care you need, when you need it.  We recommend signing up for the patient portal called "MyChart".  Sign up information is provided on this After Visit Summary.  MyChart is used to connect with patients for Virtual Visits (Telemedicine).  Patients are able to view lab/test results, encounter notes, upcoming appointments, etc.  Non-urgent messages can be sent to your provider as well.   To learn more about what you can do with MyChart, go to [email or web address].      Your next appointment:   4 month(s)  The format for your next appointment:   In Person  Provider:   Belva Crome, MD   Other Instructions  Coronary Calcium Scan A coronary calcium scan is an imaging test used to look for deposits of plaque in the inner lining of the blood vessels of the heart (coronary arteries). Plaque is made up of calcium, protein, and fatty substances. These deposits of plaque can partly clog and narrow the coronary arteries without producing any symptoms or warning signs. This puts a person at risk for a heart attack. This test is recommended for people who are at moderate risk for heart disease. The test can find plaque deposits before symptoms develop. Tell a health care provider about:  Any allergies you have.  All medicines you are taking, including vitamins, herbs, eye drops, creams, and over-the-counter medicines.  Any problems you or family members have had with anesthetic medicines.  Any blood disorders you have.  Any surgeries you have had.  Any medical conditions you have.  Whether you are pregnant or may be pregnant. What are the risks? Generally, this is a safe procedure. However, problems may occur, including:  Harm to a pregnant woman and her unborn baby. This test involves the use of radiation. Radiation exposure can be dangerous to a pregnant woman and her unborn baby. If you are pregnant or think you may be pregnant, you should not have this procedure done.  Slight increase in the risk of cancer. This is because of the radiation involved  in the test. What happens before the procedure? Ask your health care provider for any specific instructions on how to prepare for this procedure. You may be asked to avoid products that contain caffeine, tobacco, or nicotine for 4 hours before the procedure. What happens during the procedure?   You will undress and remove any jewelry from your neck or chest.  You will put on a hospital  gown.  Sticky electrodes will be placed on your chest. The electrodes will be connected to an electrocardiogram (ECG) machine to record a tracing of the electrical activity of your heart.  You will lie down on a curved bed that is attached to the CT scanner.  You may be given medicine to slow down your heart rate so that clear pictures can be created.  You will be moved into the CT scanner, and the CT scanner will take pictures of your heart. During this time, you will be asked to lie still and hold your breath for 2-3 seconds at a time while each picture of your heart is being taken. The procedure may vary among health care providers and hospitals. What happens after the procedure?  You can get dressed.  You can return to your normal activities.  It is up to you to get the results of your procedure. Ask your health care provider, or the department that is doing the procedure, when your results will be ready. Summary  A coronary calcium scan is an imaging test used to look for deposits of plaque in the inner lining of the blood vessels of the heart (coronary arteries). Plaque is made up of calcium, protein, and fatty substances.  Generally, this is a safe procedure. Tell your health care provider if you are pregnant or may be pregnant.  Ask your health care provider for any specific instructions on how to prepare for this procedure.  A CT scanner will take pictures of your heart.  You can return to your normal activities after the scan is done. This information is not intended to replace advice given to you by your health care provider. Make sure you discuss any questions you have with your health care provider. Document Revised: 05/21/2019 Document Reviewed: 05/21/2019 Elsevier Patient Education  2020 [employer].   Echocardiogram An echocardiogram is a procedure that uses painless sound waves (ultrasound) to produce an image of the heart. Images from an echocardiogram can  provide important information about:  Signs of coronary artery disease (CAD).  Aneurysm detection. An aneurysm is a weak or damaged part of an artery wall that bulges out from the normal force of blood pumping through the body.  Heart size and shape. Changes in the size or shape of the heart can be associated with certain conditions, including heart failure, aneurysm, and CAD.  Heart muscle function.  Heart valve function.  Signs of a past heart attack.  Fluid buildup around the heart.  Thickening of the heart muscle.  A tumor or infectious growth around the heart valves. Tell a health care provider about:  Any allergies you have.  All medicines you are taking, including vitamins, herbs, eye drops, creams, and over-the-counter medicines.  Any blood disorders you have.  Any surgeries you have had.  Any medical conditions you have.  Whether you are pregnant or may be pregnant. What are the risks? Generally, this is a safe procedure. However, problems may occur, including:  Allergic reaction to dye (contrast) that may be used during the procedure. What happens before the  procedure? No specific preparation is needed. You may eat and drink normally. What happens during the procedure?   An IV tube may be inserted into one of your veins.  You may receive contrast through this tube. A contrast is an injection that improves the quality of the pictures from your heart.  A gel will be applied to your chest.  A wand-like tool (transducer) will be moved over your chest. The gel will help to transmit the sound waves from the transducer.  The sound waves will harmlessly bounce off of your heart to allow the heart images to be captured in real-time motion. The images will be recorded on a computer. The procedure may vary among health care providers and hospitals. What happens after the procedure?  You may return to your normal, everyday life, including diet, activities, and  medicines, unless your health care provider tells you not to do that. Summary  An echocardiogram is a procedure that uses painless sound waves (ultrasound) to produce an image of the heart.  Images from an echocardiogram can provide important information about the size and shape of your heart, heart muscle function, heart valve function, and fluid buildup around your heart.  You do not need to do anything to prepare before this procedure. You may eat and drink normally.  After the echocardiogram is completed, you may return to your normal, everyday life, unless your health care provider tells you not to do that. This information is not intended to replace advice given to you by your health care provider. Make sure you discuss any questions you have with your health care provider. Document Revised: 02/21/2019 Document Reviewed: 12/03/2016 Elsevier Patient Education  2020 [employer].

## 2020-09-09 ENCOUNTER — Ambulatory Visit (HOSPITAL_BASED_OUTPATIENT_CLINIC_OR_DEPARTMENT_OTHER): Payer: PPO

## 2020-09-10 ENCOUNTER — Ambulatory Visit (INDEPENDENT_AMBULATORY_CARE_PROVIDER_SITE_OTHER)
Admission: RE | Admit: 2020-09-10 | Discharge: 2020-09-10 | Disposition: A | Discharge status: Home / Self Care | Payer: Self-pay | Source: Ambulatory Visit | Attending: Cardiology | Admitting: Cardiology

## 2020-09-10 ENCOUNTER — Other Ambulatory Visit: Payer: Self-pay

## 2020-09-10 DIAGNOSIS — R0789 Other chest pain: Secondary | ICD-10-CM

## 2020-09-17 ENCOUNTER — Ambulatory Visit (INDEPENDENT_AMBULATORY_CARE_PROVIDER_SITE_OTHER): Payer: PPO

## 2020-09-17 VITALS — Ht 65.0 in | Wt 178.0 lb

## 2020-09-17 DIAGNOSIS — Z0000 Encounter for general adult medical examination without abnormal findings: Secondary | ICD-10-CM

## 2020-09-17 NOTE — Patient Instructions (Signed)
Mr. Gary Case , Thank you for taking time to complete your Medicare Wellness Visit. I appreciate your ongoing commitment to your health goals. Please review the following plan we discussed and let me know if I can assist you in the future.   Screening recommendations/referrals: Colonoscopy: No longer required Recommended yearly ophthalmology/optometry visit for glaucoma screening and checkup Recommended yearly dental visit for hygiene and checkup  Vaccinations: Influenza vaccine: Declined Pneumococcal vaccine: Completed vaccines Tdap vaccine: Up to date- Due-05/01/2028 Shingles vaccine: Discuss with pharmacy  Covid-19: Completed vaccines  Advanced directives: Please bring a copy for your chart once completed  Conditions/risks identified: See problem list  Next appointment: Follow up in one year for your annual wellness visit.   Preventive Care 76 Years and Older, Male Preventive care refers to lifestyle choices and visits with your health care provider that can promote health and wellness. What does preventive care include?  A yearly physical exam. This is also called an annual well check.  Dental exams once or twice a year.  Routine eye exams. Ask your health care provider how often you should have your eyes checked.  Personal lifestyle choices, including:  Daily care of your teeth and gums.  Regular physical activity.  Eating a healthy diet.  Avoiding tobacco and drug use.  Limiting alcohol use.  Practicing safe sex.  Taking low doses of aspirin every day.  Taking vitamin and mineral supplements as recommended by your health care provider. What happens during an annual well check? The services and screenings done by your health care provider during your annual well check will depend on your age, overall health, lifestyle risk factors, and family history of disease. Counseling  Your health care provider may ask you questions about your:  Alcohol use.  Tobacco  use.  Drug use.  Emotional well-being.  Home and relationship well-being.  Sexual activity.  Eating habits.  History of falls.  Memory and ability to understand (cognition).  Work and work Astronomer. Screening  You may have the following tests or measurements:  Height, weight, and BMI.  Blood pressure.  Lipid and cholesterol levels. These may be checked every 5 years, or more frequently if you are over 46 years old.  Skin check.  Lung cancer screening. You may have this screening every year starting at age 76 if you have a 30-pack-year history of smoking and currently smoke or have quit within the past 15 years.  Fecal occult blood test (FOBT) of the stool. You may have this test every year starting at age 76.  Flexible sigmoidoscopy or colonoscopy. You may have a sigmoidoscopy every 5 years or a colonoscopy every 10 years starting at age 76.  Prostate cancer screening. Recommendations will vary depending on your family history and other risks.  Hepatitis C blood test.  Hepatitis B blood test.  Sexually transmitted disease (STD) testing.  Diabetes screening. This is done by checking your blood sugar (glucose) after you have not eaten for a while (fasting). You may have this done every 1-3 years.  Abdominal aortic aneurysm (AAA) screening. You may need this if you are a current or former smoker.  Osteoporosis. You may be screened starting at age 76 if you are at high risk. Talk with your health care provider about your test results, treatment options, and if necessary, the need for more tests. Vaccines  Your health care provider may recommend certain vaccines, such as:  Influenza vaccine. This is recommended every year.  Tetanus, diphtheria, and acellular pertussis (Tdap,  Td) vaccine. You may need a Td booster every 10 years.  Zoster vaccine. You may need this after age 76.  Pneumococcal 13-valent conjugate (PCV13) vaccine. One dose is recommended after age  25.  Pneumococcal polysaccharide (PPSV23) vaccine. One dose is recommended after age 35. Talk to your health care provider about which screenings and vaccines you need and how often you need them. This information is not intended to replace advice given to you by your health care provider. Make sure you discuss any questions you have with your health care provider. Document Released: 11/27/2015 Document Revised: 07/20/2016 Document Reviewed: 09/01/2015 Elsevier Interactive Patient Education  2017 [place] Prevention in the Home Falls can cause injuries. They can happen to people of all ages. There are many things you can do to make your home safe and to help prevent falls. What can I do on the outside of my home?  Regularly fix the edges of walkways and driveways and fix any cracks.  Remove anything that might make you trip as you walk through a door, such as a raised step or threshold.  Trim any bushes or trees on the path to your home.  Use bright outdoor lighting.  Clear any walking paths of anything that might make someone trip, such as rocks or tools.  Regularly check to see if handrails are loose or broken. Make sure that both sides of any steps have handrails.  Any raised decks and porches should have guardrails on the edges.  Have any leaves, snow, or ice cleared regularly.  Use sand or salt on walking paths during winter.  Clean up any spills in your garage right away. This includes oil or grease spills. What can I do in the bathroom?  Use night lights.  Install grab bars by the toilet and in the tub and shower. Do not use towel bars as grab bars.  Use non-skid mats or decals in the tub or shower.  If you need to sit down in the shower, use a plastic, non-slip stool.  Keep the floor dry. Clean up any water that spills on the floor as soon as it happens.  Remove soap buildup in the tub or shower regularly.  Attach bath mats securely with double-sided  non-slip rug tape.  Do not have throw rugs and other things on the floor that can make you trip. What can I do in the bedroom?  Use night lights.  Make sure that you have a light by your bed that is easy to reach.  Do not use any sheets or blankets that are too big for your bed. They should not hang down onto the floor.  Have a firm chair that has side arms. You can use this for support while you get dressed.  Do not have throw rugs and other things on the floor that can make you trip. What can I do in the kitchen?  Clean up any spills right away.  Avoid walking on wet floors.  Keep items that you use a lot in easy-to-reach places.  If you need to reach something above you, use a strong step stool that has a grab bar.  Keep electrical cords out of the way.  Do not use floor polish or wax that makes floors slippery. If you must use wax, use non-skid floor wax.  Do not have throw rugs and other things on the floor that can make you trip. What can I do with my stairs?  Do not  leave any items on the stairs.  Make sure that there are handrails on both sides of the stairs and use them. Fix handrails that are broken or loose. Make sure that handrails are as long as the stairways.  Check any carpeting to make sure that it is firmly attached to the stairs. Fix any carpet that is loose or worn.  Avoid having throw rugs at the top or bottom of the stairs. If you do have throw rugs, attach them to the floor with carpet tape.  Make sure that you have a light switch at the top of the stairs and the bottom of the stairs. If you do not have them, ask someone to add them for you. What else can I do to help prevent falls?  Wear shoes that:  Do not have high heels.  Have rubber bottoms.  Are comfortable and fit you well.  Are closed at the toe. Do not wear sandals.  If you use a stepladder:  Make sure that it is fully opened. Do not climb a closed stepladder.  Make sure that both  sides of the stepladder are locked into place.  Ask someone to hold it for you, if possible.  Clearly mark and make sure that you can see:  Any grab bars or handrails.  First and last steps.  Where the edge of each step is.  Use tools that help you move around (mobility aids) if they are needed. These include:  Canes.  Walkers.  Scooters.  Crutches.  Turn on the lights when you go into a dark area. Replace any light bulbs as soon as they burn out.  Set up your furniture so you have a clear path. Avoid moving your furniture around.  If any of your floors are uneven, fix them.  If there are any pets around you, be aware of where they are.  Review your medicines with your doctor. Some medicines can make you feel dizzy. This can increase your chance of falling. Ask your doctor what other things that you can do to help prevent falls. This information is not intended to replace advice given to you by your health care provider. Make sure you discuss any questions you have with your health care provider. Document Released: 08/27/2009 Document Revised: 04/07/2016 Document Reviewed: 12/05/2014 Elsevier Interactive Patient Education  2017 [employer].

## 2020-09-17 NOTE — Progress Notes (Signed)
Subjective:   Gary Case is a 74 y.o. male who presents for Medicare Annual/Subsequent preventive examination.  I connected with Emanuelle today by telephone and verified that I am speaking with the correct person using two identifiers. Location patient: home Location provider: work Persons participating in the virtual visit: patient, Engineer, civil (consulting).    I discussed the limitations, risks, security and privacy concerns of performing an evaluation and management service by telephone and the availability of in person appointments. I also discussed with the patient that there may be a patient responsible charge related to this service. The patient expressed understanding and verbally consented to this telephonic visit.    Interactive audio and video telecommunications were attempted between this provider and patient, however failed, due to patient having technical difficulties OR patient did not have access to video capability.  We continued and completed visit with audio only.  Some vital signs may be absent or patient reported.     Review of Systems     Cardiac Risk Factors include: advanced age (>71men, >61 women);male gender;dyslipidemia     Objective:    Today's Vitals   09/17/20 1548  Weight: 178 lb (80.7 kg)  Height: 5\' 5"  (1.651 m)   Body mass index is 29.62 kg/m.  Advanced Directives 09/17/2020  Does Patient Have a Medical Advance Directive? No  Would patient like information on creating a medical advance directive? No - Patient declined    Current Medications (verified) Outpatient Encounter Medications as of 09/17/2020  Medication Sig  . allopurinol (ZYLOPRIM) 100 MG tablet Take 2 tablets (200 mg total) by mouth daily.  Marland Kitchen aspirin EC 81 MG tablet Take 1 tablet (81 mg total) by mouth daily.  . colchicine 0.6 MG tablet Take 1 tablet (0.6 mg total) by mouth daily.  . meloxicam (MOBIC) 7.5 MG tablet Take one with food daily ONLY AS NEEDED.  Marland Kitchen pantoprazole (PROTONIX) 40 MG  tablet Take 1 tablet (40 mg total) by mouth daily.   No facility-administered encounter medications on file as of 09/17/2020.    Allergies (verified) Patient has no known allergies.   History: Past Medical History:  Diagnosis Date  . Acute gastritis 08/03/2020  . Depression   . Elevated blood sugar 05/01/2018  . Elevated cholesterol 05/01/2018  . Gastroesophageal reflux disease 08/03/2020  . GERD (gastroesophageal reflux disease)   . Gout 01/01/2018  . Laceration of left upper extremity 05/01/2018  . Screen for colon cancer 01/01/2018  . Viral upper respiratory tract infection 01/01/2018   Past Surgical History:  Procedure Laterality Date  . NO PAST SURGERIES     Family History  Problem Relation Age of Onset  . Arthritis Mother   . Cancer Mother   . Early death Mother   . Alcohol abuse Father   . Diabetes Father   . Hypertension Father   . Kidney disease Father    Social History   Socioeconomic History  . Marital status: Divorced    Spouse name: Not on file  . Number of children: Not on file  . Years of education: Not on file  . Highest education level: Not on file  Occupational History  . Not on file  Tobacco Use  . Smoking status: Former Games developer  . Smokeless tobacco: Never Used  Substance and Sexual Activity  . Alcohol use: No  . Drug use: Not on file  . Sexual activity: Not on file  Other Topics Concern  . Not on file  Social History Narrative  . Not  on file   Social Determinants of Health   Financial Resource Strain: Low Risk   . Difficulty of Paying Living Expenses: Not hard at all  Food Insecurity: No Food Insecurity  . Worried About Programme researcher, broadcasting/film/video in the Last Year: Never true  . Ran Out of Food in the Last Year: Never true  Transportation Needs: No Transportation Needs  . Lack of Transportation (Medical): No  . Lack of Transportation (Non-Medical): No  Physical Activity: Sufficiently Active  . Days of Exercise per Week: 5 days  . Minutes of  Exercise per Session: 30 min  Stress: No Stress Concern Present  . Feeling of Stress : Not at all  Social Connections: Moderately Isolated  . Frequency of Communication with Friends and Family: More than three times a week  . Frequency of Social Gatherings with Friends and Family: Once a week  . Attends Religious Services: More than 4 times per year  . Active Member of Clubs or Organizations: No  . Attends Banker Meetings: Never  . Marital Status: Divorced    Tobacco Counseling Counseling given: Not Answered   Clinical Intake:  Pre-visit preparation completed: Yes  Pain : No/denies pain     Nutritional Status: BMI 25 -29 Overweight Nutritional Risks: None Diabetes: No  How often do you need to have someone help you when you read instructions, pamphlets, or other written materials from your doctor or pharmacy?: 1 - Never What is the last grade level you completed in school?: Bachelor's Degree  Diabetic?No  Interpreter Needed?: No  Information entered by :: Thomasenia Sales LPN   Activities of Daily Living In your present state of health, do you have any difficulty performing the following activities: 09/17/2020  Hearing? N  Vision? N  Difficulty concentrating or making decisions? N  Walking or climbing stairs? N  Dressing or bathing? N  Doing errands, shopping? N  Preparing Food and eating ? N  Using the Toilet? N  In the past six months, have you accidently leaked urine? N  Do you have problems with loss of bowel control? N  Managing your Medications? N  Managing your Finances? N  Housekeeping or managing your Housekeeping? N  Some recent data might be hidden    Patient Care Team: [name], MD as PCP - General (Family Medicine)  Indicate any recent Medical Services you may have received from other than [facility] providers in the past year (date may be approximate).     Assessment:   This is a routine wellness examination for  Chrissie Noa.  Hearing/Vision screen  Hearing Screening   125Hz  250Hz  500Hz  1000Hz  2000Hz  3000Hz  4000Hz  6000Hz  8000Hz   Right ear:           Left ear:           Comments: No issues  Vision Screening Comments: Wears glasses Last eye exam-[facility]  Dietary issues and exercise activities discussed: Current Exercise Habits: Home exercise routine, Type of exercise: walking, Time (Minutes): 30, Frequency (Times/Week): 5, Weekly Exercise (Minutes/Week): 150, Intensity: Mild, Exercise limited by: None identified  Goals   None    Depression Screen PHQ 2/9 Scores 09/17/2020 12/12/2019  PHQ - 2 Score 0 0    Fall Risk Fall Risk  09/17/2020 12/12/2019 06/13/2019  Falls in the past year? 0 0 0  Comment - - Emmi Telephone Survey: data to providers prior to load  Number falls in past yr: 0 - -  Injury with Fall? 0 - -  Follow up Falls prevention discussed - -    Any stairs in or around the home? Yes If so, are there any without handrail s? No  Home free of loose throw rugs in walkways, pet beds, electrical cords, etc? Yes  Adequate lighting in your home to reduce risk of falls? Yes   ASSISTIVE DEVICES UTILIZED TO PREVENT FALLS:  Life alert? No  Use of a cane, walker or w/c? No  Grab bars in the bathroom? Yes  Shower chair or bench in shower? No  Elevated toilet seat or a handicapped toilet? No   TIMED UP AND GO:  Was the test performed? No . Phone visit   Cognitive Function:No cognitive impairment noted        Immunizations Immunization History  Administered Date(s) Administered  . Pneumococcal Conjugate-13 04/04/2017  . Pneumococcal Polysaccharide-23 05/01/2018  . Tdap 05/01/2018    TDAP status: Up to date   Flu Vaccine status: Declined, Education has been provided regarding the importance of this vaccine but patient still declined. Advised may receive this vaccine at local pharmacy or Health Dept. Aware to provide a copy of the vaccination record if obtained from local  pharmacy or Health Dept. Verbalized acceptance and understanding.   Pneumococcal vaccine status: Up to date   Covid-19 vaccine status: Completed vaccines  Qualifies for Shingles Vaccine? Yes   Zostavax completed No   Shingrix Completed?: No.    Education has been provided regarding the importance of this vaccine. Patient has been advised to call insurance company to determine out of pocket expense if they have not yet received this vaccine. Advised may also receive vaccine at local pharmacy or Health Dept. Verbalized acceptance and understanding.  Screening Tests Health Maintenance  Topic Date Due  . COVID-19 Vaccine (1) 10/03/2020 (Originally 03/21/1958)  . INFLUENZA VACCINE  02/11/2021 (Originally 06/14/2020)  . TETANUS/TDAP  05/01/2028  . Hepatitis C Screening  Completed  . PNA vac Low Risk Adult  Completed    Health Maintenance  There are no preventive care reminders to display for this patient.  Colorectal cancer screening: No longer required.   Lung Cancer Screening: (Low Dose CT Chest recommended if Age 2-80 years, 30 pack-year currently smoking OR have quit w/in 15years.) does not qualify.     Additional Screening:  Hepatitis C Screening: Completed 04/04/2017  Vision Screening: Recommended annual ophthalmology exams for early detection of glaucoma and other disorders of the eye. Is the patient up to date with their annual eye exam?  Yes  Who is the provider or what is the name of the office in which the patient attends annual eye exams? [facility]  Dental Screening: Recommended annual dental exams for proper oral hygiene  Community Resource Referral / Chronic Care Management: CRR required this visit?  No   CCM required this visit?  No      Plan:     I have personally reviewed and noted the following in the patient's chart:   . Medical and social history . Use of alcohol, tobacco or illicit drugs  . Current medications and supplements . Functional ability  and status . Nutritional status . Physical activity . Advanced directives . List of other physicians . Hospitalizations, surgeries, and ER visits in previous 12 months . Vitals . Screenings to include cognitive, depression, and falls . Referrals and appointments  In addition, I have reviewed and discussed with patient certain preventive protocols, quality metrics, and best practice recommendations. A written personalized care plan for preventive  services as well as general preventive health recommendations were provided to patient.   Due to this being a telephonic visit, the after visit summary with patients personalized plan was offered to patient via mail or my-chart.  Patient would like to access on my-chart.   Roanna Raider, LPN   [id number]  Nurse Health Advisor  Nurse Notes: None

## 2020-09-23 ENCOUNTER — Ambulatory Visit: Payer: PPO | Admitting: Gastroenterology

## 2020-10-21 ENCOUNTER — Telehealth: Payer: Self-pay | Admitting: Family Medicine

## 2020-10-21 NOTE — Progress Notes (Signed)
  Chronic Care Management   Note  10/21/2020 Name: Adalberto Metzgar MRN: [id number] DOB: 03/30/1946  Nicklas Mcsweeney is a 74 y.o. year old male who is a primary care patient of Burlie, Cajamarca, MD. I reached out to [name] by phone today in response to a referral sent by Mr. Areli Siegman's PCP, [name], MD.   Mr. Jellison was given information about Chronic Care Management services today including:  1. CCM service includes personalized support from designated clinical staff supervised by his physician, including individualized plan of care and coordination with other care providers 2. 24/7 contact phone numbers for assistance for urgent and routine care needs. 3. Service will only be billed when office clinical staff spend 20 minutes or more in a month to coordinate care. 4. Only one practitioner may furnish and bill the service in a calendar month. 5. The patient may stop CCM services at any time (effective at the end of the month) by phone call to the office staff.   Patient agreed to services and verbal consent obtained.   Follow up plan:   Carley Perdue [facility] Scheduler

## 2020-11-02 ENCOUNTER — Other Ambulatory Visit: Payer: Self-pay | Admitting: Family Medicine

## 2020-11-02 DIAGNOSIS — M109 Gout, unspecified: Secondary | ICD-10-CM

## 2020-11-03 NOTE — Telephone Encounter (Signed)
Please see message and advise.  Thank you. Last OV 09/17/20 Last fill 08/03/20  #30/0

## 2020-11-14 ENCOUNTER — Other Ambulatory Visit: Payer: Self-pay | Admitting: Family Medicine

## 2020-11-14 DIAGNOSIS — M109 Gout, unspecified: Secondary | ICD-10-CM

## 2020-11-16 ENCOUNTER — Telehealth: Payer: Self-pay

## 2020-11-16 NOTE — Progress Notes (Signed)
    Chronic Care Management Pharmacy Assistant   Name: Gary Case  MRN: [id number] DOB: Jan 06, 1946  Reason for Encounter: Medication Review/Initial question for initial visit with clinical pharmacist.   PCP : [name], MD  Allergies:  No Known Allergies  Medications: Outpatient Encounter Medications as of 11/16/2020  Medication Sig  . allopurinol (ZYLOPRIM) 100 MG tablet Take 2 tablets (200 mg total) by mouth daily.  Marland Kitchen aspirin EC 81 MG tablet Take 1 tablet (81 mg total) by mouth daily.  . colchicine 0.6 MG tablet TAKE  TABLET BY MOUTH ONCE DAILY  . meloxicam (MOBIC) 7.5 MG tablet TAKE 1 TABLET BY MOUTH ONCE DAILY WITH FOOD ONLY AS NEEDED  . pantoprazole (PROTONIX) 40 MG tablet Take 1 tablet (40 mg total) by mouth daily.   No facility-administered encounter medications on file as of 11/16/2020.    Current Diagnosis: Patient Active Problem List   Diagnosis Date Noted  . Chest discomfort 08/25/2020  . DOE (dyspnea on exertion) 08/25/2020  . Depression   . GERD (gastroesophageal reflux disease)   . Gastroesophageal reflux disease 08/03/2020  . Acute gastritis 08/03/2020  . Laceration of left upper extremity 05/01/2018  . Elevated cholesterol 05/01/2018  . Elevated blood sugar 05/01/2018  . Viral upper respiratory tract infection 01/01/2018  . Gout 01/01/2018  . Screen for colon cancer 01/01/2018    Goals Addressed   None    Have you seen any other providers since your last visit? no Any changes in your medications or health? no Any side effects from any medications? no Do you have an symptoms or problems not managed by your medications? no Any concerns about your health right now? no Has your provider asked that you check blood pressure, blood sugar, or follow special diet at home? no   Patient states he does not check his blood pressure or blood sugar at home, but he does try to watch what he eats.  Patient states he cooks at home rarely uses salt.  Do  you get any type of exercise on a regular basis? Yes  Patient reports he walks a couple of miles through out the week, but not every day maybe every other day. Can you think of a goal you would like to reach for your health?   Patient states he will think of a goal, and inform the clinical pharmacists at his visit.  Do you have any problems getting your medications? no Is there anything that you would like to discuss during the appointment?   Patient states he can not think of anything at this time to discuss.  Please bring medications and supplements to appointment  Follow-Up:  Pharmacist Review   Everlean Cherry Clinical Pharmacist Assistant [phone]

## 2020-11-17 NOTE — Chronic Care Management (AMB) (Addendum)
Chronic Care Management Pharmacy  Name: Gary Case  MRN: [id number] DOB: Jul 09, 1946   Chief Complaint/ HPI  Gary Case,  75 y.o. , male presents for his Initial CCM visit with the clinical pharmacist via telephone.  PCP : [name], MD Patient Care Team: [name], MD as PCP - General (Family Medicine) Gaspar Cola, [facility] as Pharmacist (Pharmacist)  Patient's chronic conditions include: GERD, Depression, Osteoarthritis and Gout   Office Visits: 08/03/20: Patient presented to Dr. Doreene Burke for hospital follow-up. Inomethacin stopped. Colchicine decreased to 0.6 mg daily. Allopurinol 200 mg daily started. Pantoprazole 40 mg daily and meloxicam 7.5mg  daily started.   Consult Visit: 08/25/20: Patient presented to Dr. Tomie China (Cardiology) for chest discomfort. CT Calcium score negative.  07/25/20: Patient presented to ED. Patient with severe indigestion + nausea.   Objective: No Known Allergies  Medications: Outpatient Encounter Medications as of 11/24/2020  Medication Sig  . allopurinol (ZYLOPRIM) 100 MG tablet Take 2 tablets (200 mg total) by mouth daily. (Patient taking differently: Take 100 mg by mouth daily.)  . aspirin EC 81 MG tablet Take 1 tablet (81 mg total) by mouth daily.  . colchicine 0.6 MG tablet TAKE  TABLET BY MOUTH ONCE DAILY  . meloxicam (MOBIC) 7.5 MG tablet TAKE 1 TABLET BY MOUTH ONCE DAILY WITH FOOD ONLY AS NEEDED  . pantoprazole (PROTONIX) 40 MG tablet Take 1 tablet (40 mg total) by mouth daily.   No facility-administered encounter medications on file as of 11/24/2020.    Wt Readings from Last 3 Encounters:  09/17/20 178 lb (80.7 kg)  08/25/20 178 lb (80.7 kg)  08/03/20 176 lb 3.2 oz (79.9 kg)    Lab Results  Component Value Date   CREATININE 0.96 08/05/2020   BUN 14 08/05/2020   GFR 76.08 08/05/2020   NA 142 08/05/2020   K 5.1 08/05/2020   CALCIUM 9.3 08/05/2020   CO2 31 08/05/2020     Current  Diagnosis/Assessment:  SDOH Interventions   Flowsheet Row Most Recent Value  SDOH Interventions   Financial Strain Interventions Intervention Not Indicated  Transportation Interventions Intervention Not Indicated      Goals Addressed            This Visit's Progress   . Chronic Care Management       CARE PLAN ENTRY (see longitudinal plan of care for additional care plan information)  Current Barriers:  . Chronic Disease Management support, education, and care coordination needs related to GERD, Depression, Osteoarthritis and Gout    Gout . Pharmacist Clinical Goal(s) o Over the next 90 days, patient will work with PharmD and providers to prevent gout flares  . Current regimen:  o Allopurinol 100 mg 2 tablets daily  o Colchicine 0.6 mg daily  . Interventions: o Counseled patient on low purine diet plan. Counseled patient to reduce consumption of high-fructose corn syrup, sweetened soft drinks, fruit juices, meat, and seafood. Counseled patient to avoid alcohol consumption.   GERD / Heartburn . Pharmacist Clinical Goal(s) o Over the next 90 days, patient will work with PharmD and providers to prevent heartburn symptoms   . Current regimen:  o Pantoprazole 40 mg daily  . Interventions: o Counseled patient to avoid laying down after eating a meal  . Patient self care activities - Over the next 90 days, patient will: o Avoid triggers that worsen heartburn symptoms   Medication management . Pharmacist Clinical Goal(s): o Over the next 90 days, patient will work with PharmD  and providers to achieve optimal medication adherence . Current pharmacy: [facility] . Interventions o Comprehensive medication review performed. o Utilize [facility] for medication synchronization, packaging and delivery o Verbal consent obtained for [facility] enhanced pharmacy services (medication synchronization, adherence packaging, delivery coordination). A medication sync plan was  created to allow patient to get all medications delivered once every 30 to 90 days per patient preference. Patient understands they have freedom to choose pharmacy and clinical pharmacist will coordinate care between all prescribers and [facility]. . Patient self care activities - Over the next 90 days, patient will: o Take medications as prescribed o Report any questions or concerns to PharmD and/or provider(s)      Gout   Uric Acid, Serum  Date Value Ref Range Status  08/05/2020 6.0 4.0 - 7.8 mg/dL Final  78/93/8101 5.8 4.0 - 7.8 mg/dL Final  75/08/2584 6.6 4.0 - 7.8 mg/dL Final     Goal Uric Acid < 6 mg/dL   Medications that may increase uric acid levels: Aspirin  Last gout flare: November   Patient has failed these meds in past: indomethacin (Heartburn)  Patient is currently controlled on the following medications:  . Allopurinol 100 mg 2 tablets daily  . Colchicine 0.6 mg daily   We discussed:  Counseled patient on low purine diet plan. Counseled patient to reduce consumption of high-fructose corn syrup, sweetened soft drinks, fruit juices, meat, and seafood. Counseled patient to avoid alcohol consumption.    Patient has only been taking one tablet of allopurinol daily recently. He reports his last flare was back in November, which he self managed using an extra dose of his colchicine.   Counseled patient on the preventive nature of allopurinol to prevent uric acid accumulation and encouraged him to continue with daily adherence to the medication.    Plan  Continue current medications  GERD   Patient reports heartburn on occasion. Expresses understanding to avoid triggers such as caffeine and shredded wheat . He finds that a glass of warm water will provide immediate relief for his symptoms, but he will also keep some tums on hand in case he needs something else to manage any breakthrough symptoms.   Currently controlled on: . Pantoprazole 40 mg daily   Plan    Continue current medication.  Osteoarthritis   Patient has failed these meds in past: NA Patient is currently controlled on the following medications:  Marland Kitchen Meloxicam 7.5 mg daily PRN (takes 1-2 times weekly)   We discussed:  Discussed risks of NSAID use and the importance of minimizing use for when he has more severe pain and to take with food and plenty of fluids.   Plan  Recommend acetaminophen CR 650 mg twice daily for mild-moderate osteoarthritis pain.   Misc / OTC    . Aspirin 81 mg daily   We discussed:  Denies unusual bruising or bleeding   Plan  Continue current medications  Vaccines   Reviewed and discussed patient's vaccination history.    Immunization History  Administered Date(s) Administered  . PFIZER(Purple Top)SARS-COV-2 Vaccination 01/13/2020, 02/03/2020, 09/22/2020  . Pneumococcal Conjugate-13 04/04/2017  . Pneumococcal Polysaccharide-23 05/01/2018  . Tdap 05/01/2018   Medication Management   Patient's preferred pharmacy is:  [facility] - [place], [place] - [address] [place] [address] Phone: [phone] Fax: [phone]  Uses pill box? Yes We discussed: Current pharmacy is preferred with insurance plan and patient is satisfied with pharmacy services  Plan  Utilize [facility] for medication synchronization, packaging and delivery  Verbal consent obtained for [facility] enhanced pharmacy services (medication synchronization, adherence packaging, delivery coordination). A medication sync plan was created to allow patient to get all medications delivered once every 30 to 90 days per patient preference. Patient understands they have freedom to choose pharmacy and clinical pharmacist will coordinate care between all prescribers and [facility].  Follow up: 6 month phone visit  [place] Pharmacist [facility]   [phone]

## 2020-11-23 ENCOUNTER — Telehealth: Payer: Self-pay

## 2020-11-23 NOTE — Progress Notes (Signed)
Spoke to patient to confirmed patient telephone appointment on 11/24/2020 for CCM at 11:00 am with Angelena Sole the Clinical pharmacist.   Everlean Cherry Clinical Pharmacist Assistant [phone]

## 2020-11-24 ENCOUNTER — Ambulatory Visit: Payer: PPO

## 2020-11-24 DIAGNOSIS — M109 Gout, unspecified: Secondary | ICD-10-CM

## 2020-11-24 DIAGNOSIS — K219 Gastro-esophageal reflux disease without esophagitis: Secondary | ICD-10-CM

## 2020-11-26 NOTE — Patient Instructions (Addendum)
Visit Information It was great speaking with you today!  Please let me know if you have any questions about our visit. Goals Addressed            This Visit's Progress   . Chronic Care Management       CARE PLAN ENTRY (see longitudinal plan of care for additional care plan information)  Current Barriers:  . Chronic Disease Management support, education, and care coordination needs related to GERD, Depression, Osteoarthritis and Gout    Gout . Pharmacist Clinical Goal(s) o Over the next 90 days, patient will work with PharmD and providers to prevent gout flares  . Current regimen:  o Allopurinol 100 mg 2 tablets daily  o Colchicine 0.6 mg daily  . Interventions: o Counseled patient on low purine diet plan. Counseled patient to reduce consumption of high-fructose corn syrup, sweetened soft drinks, fruit juices, meat, and seafood. Counseled patient to avoid alcohol consumption.   GERD / Heartburn . Pharmacist Clinical Goal(s) o Over the next 90 days, patient will work with PharmD and providers to prevent heartburn symptoms   . Current regimen:  o Pantoprazole 40 mg daily  . Interventions: o Counseled patient to avoid laying down after eating a meal  . Patient self care activities - Over the next 90 days, patient will: o Avoid triggers that worsen heartburn symptoms   Medication management . Pharmacist Clinical Goal(s): o Over the next 90 days, patient will work with PharmD and providers to achieve optimal medication adherence . Current pharmacy: [facility] . Interventions o Comprehensive medication review performed. o Utilize [facility] for medication synchronization, packaging and delivery o Verbal consent obtained for [facility] enhanced pharmacy services (medication synchronization, adherence packaging, delivery coordination). A medication sync plan was created to allow patient to get all medications delivered once every 30 to 90 days per patient preference.  Patient understands they have freedom to choose pharmacy and clinical pharmacist will coordinate care between all prescribers and [facility]. . Patient self care activities - Over the next 90 days, patient will: o Take medications as prescribed o Report any questions or concerns to PharmD and/or provider(s)       The patient verbalized understanding of instructions, educational materials, and care plan provided today and agreed to receive a mailed copy of patient instructions, educational materials, and care plan.   Telephone follow up appointment with pharmacy team member scheduled for: 05/31/21  At 11:00 AM  Garey Ham Clinical Pharmacist [facility]  [phone]

## 2020-12-01 ENCOUNTER — Telehealth: Payer: Self-pay

## 2020-12-01 NOTE — Progress Notes (Signed)
    Chronic Care Management Pharmacy Assistant   Name: Gary Case  MRN: [id number] DOB: 06/25/1946  Reason for Encounter: Medication Review/Onboarding form.   PCP : [name], MD  Allergies:  No Known Allergies  Medications: Outpatient Encounter Medications as of 12/01/2020  Medication Sig  . allopurinol (ZYLOPRIM) 100 MG tablet Take 2 tablets (200 mg total) by mouth daily. (Patient taking differently: Take 100 mg by mouth daily.)  . aspirin EC 81 MG tablet Take 1 tablet (81 mg total) by mouth daily.  . colchicine 0.6 MG tablet TAKE  TABLET BY MOUTH ONCE DAILY  . meloxicam (MOBIC) 7.5 MG tablet TAKE 1 TABLET BY MOUTH ONCE DAILY WITH FOOD ONLY AS NEEDED  . pantoprazole (PROTONIX) 40 MG tablet Take 1 tablet (40 mg total) by mouth daily.   No facility-administered encounter medications on file as of 12/01/2020.    Current Diagnosis: Patient Active Problem List   Diagnosis Date Noted  . Chest discomfort 08/25/2020  . DOE (dyspnea on exertion) 08/25/2020  . Depression   . GERD (gastroesophageal reflux disease)   . Gastroesophageal reflux disease 08/03/2020  . Acute gastritis 08/03/2020  . Laceration of left upper extremity 05/01/2018  . Elevated cholesterol 05/01/2018  . Elevated blood sugar 05/01/2018  . Viral upper respiratory tract infection 01/01/2018  . Gout 01/01/2018  . Screen for colon cancer 01/01/2018    Goals Addressed   None    Completed form for [facility] to come [facility] with [facility] for clinical pharmacist to view. Call previous pharmacy [facility] to request medication transfer on 12/01/2020.  Follow-Up:  Pharmacist Review   Everlean Cherry Clinical Pharmacist Assistant [phone]

## 2020-12-07 DIAGNOSIS — H2513 Age-related nuclear cataract, bilateral: Secondary | ICD-10-CM | POA: Diagnosis not present

## 2020-12-09 DIAGNOSIS — M62830 Muscle spasm of back: Secondary | ICD-10-CM | POA: Diagnosis not present

## 2020-12-09 DIAGNOSIS — M9904 Segmental and somatic dysfunction of sacral region: Secondary | ICD-10-CM | POA: Diagnosis not present

## 2020-12-09 DIAGNOSIS — M5441 Lumbago with sciatica, right side: Secondary | ICD-10-CM | POA: Diagnosis not present

## 2020-12-09 DIAGNOSIS — M9903 Segmental and somatic dysfunction of lumbar region: Secondary | ICD-10-CM | POA: Diagnosis not present

## 2020-12-11 ENCOUNTER — Other Ambulatory Visit: Payer: Self-pay

## 2020-12-11 ENCOUNTER — Other Ambulatory Visit: Payer: Self-pay | Admitting: Family Medicine

## 2020-12-11 DIAGNOSIS — K2900 Acute gastritis without bleeding: Secondary | ICD-10-CM

## 2020-12-11 DIAGNOSIS — K219 Gastro-esophageal reflux disease without esophagitis: Secondary | ICD-10-CM

## 2020-12-11 DIAGNOSIS — M109 Gout, unspecified: Secondary | ICD-10-CM

## 2020-12-11 MED ORDER — PANTOPRAZOLE SODIUM 40 MG PO TBEC: 40.0000 mg | DELAYED_RELEASE_TABLET | Freq: Every day | ORAL | 3 refills | Status: DC

## 2020-12-17 DIAGNOSIS — M5441 Lumbago with sciatica, right side: Secondary | ICD-10-CM | POA: Diagnosis not present

## 2020-12-17 DIAGNOSIS — M62830 Muscle spasm of back: Secondary | ICD-10-CM | POA: Diagnosis not present

## 2020-12-17 DIAGNOSIS — M9903 Segmental and somatic dysfunction of lumbar region: Secondary | ICD-10-CM | POA: Diagnosis not present

## 2020-12-17 DIAGNOSIS — M9904 Segmental and somatic dysfunction of sacral region: Secondary | ICD-10-CM | POA: Diagnosis not present

## 2020-12-29 ENCOUNTER — Ambulatory Visit: Payer: PPO | Admitting: Cardiology

## 2021-01-14 DIAGNOSIS — M5441 Lumbago with sciatica, right side: Secondary | ICD-10-CM | POA: Diagnosis not present

## 2021-01-14 DIAGNOSIS — M9903 Segmental and somatic dysfunction of lumbar region: Secondary | ICD-10-CM | POA: Diagnosis not present

## 2021-01-14 DIAGNOSIS — M9904 Segmental and somatic dysfunction of sacral region: Secondary | ICD-10-CM | POA: Diagnosis not present

## 2021-01-14 DIAGNOSIS — M62830 Muscle spasm of back: Secondary | ICD-10-CM | POA: Diagnosis not present

## 2021-01-27 ENCOUNTER — Telehealth: Payer: Self-pay

## 2021-01-27 NOTE — Chronic Care Management (AMB) (Signed)
01/27/2021- Called patient to follow up on message given to office requesting a call from [facility]. Spoke with patient and informed that PharmD is at another office location in clinic and wondered if there was something I could help him with. Patient informed me he didn't know what I did and he wanted to speak with the Pharmacist- Alex. Explained to patient that I am the Pharmacist Assistant and I could help him with answering questions or relaying a message to the pharmacist. He told me no and he wanted to speak to the pharmacist, asked if this was in reference to a medication or a patient assistance, he said yes to medication but did not want to give me the name. Angelena Sole, CPP notified of call and request for him to contact patient personally.  Billee Cashing, CMA Clinical Pharmacist Assistant [phone]

## 2021-02-01 ENCOUNTER — Telehealth: Payer: Self-pay | Admitting: Family Medicine

## 2021-02-01 NOTE — Telephone Encounter (Signed)
Returned patients call, per patient he no longer has a question. Advise to give [place] a call back if/when he does. Pharmacy changed

## 2021-02-01 NOTE — Telephone Encounter (Signed)
Patient would like his pharmacy to be changed back to [place]. He also has a question about his gout medication. Please call him back at [phone].

## 2021-02-22 ENCOUNTER — Other Ambulatory Visit: Payer: Self-pay

## 2021-02-22 ENCOUNTER — Encounter: Payer: Self-pay | Admitting: Family Medicine

## 2021-02-22 ENCOUNTER — Ambulatory Visit (INDEPENDENT_AMBULATORY_CARE_PROVIDER_SITE_OTHER): Payer: PPO | Admitting: Family Medicine

## 2021-02-22 VITALS — BP 126/84 | HR 52 | Temp 97.4°F | Ht 65.0 in | Wt 178.0 lb

## 2021-02-22 DIAGNOSIS — K2900 Acute gastritis without bleeding: Secondary | ICD-10-CM

## 2021-02-22 DIAGNOSIS — M109 Gout, unspecified: Secondary | ICD-10-CM | POA: Diagnosis not present

## 2021-02-22 DIAGNOSIS — K219 Gastro-esophageal reflux disease without esophagitis: Secondary | ICD-10-CM

## 2021-02-22 LAB — COMPREHENSIVE METABOLIC PANEL
ALT: 16 U/L (ref 0–53)
AST: 16 U/L (ref 0–37)
Albumin: 4.1 g/dL (ref 3.5–5.2)
Alkaline Phosphatase: 74 U/L (ref 39–117)
BUN: 16 mg/dL (ref 6–23)
CO2: 30 mEq/L (ref 19–32)
Calcium: 9.3 mg/dL (ref 8.4–10.5)
Chloride: 105 mEq/L (ref 96–112)
Creatinine, Ser: 0.89 mg/dL (ref 0.40–1.50)
GFR: 83 mL/min (ref >60.00)
Glucose, Bld: 96 mg/dL (ref 70–99)
Potassium: 4.5 mEq/L (ref 3.5–5.1)
Sodium: 141 mEq/L (ref 135–145)
Total Bilirubin: 0.9 mg/dL (ref 0.2–1.2)
Total Protein: 6.7 g/dL (ref 6.0–8.3)

## 2021-02-22 LAB — URIC ACID: Uric Acid, Serum: 4.9 mg/dL (ref 4.0–7.8)

## 2021-02-22 MED ORDER — ALLOPURINOL 100 MG PO TABS
100.0000 mg | ORAL_TABLET | Freq: Every day | ORAL | 1 refills | Status: DC
Start: 2021-02-22 — End: 2021-05-31

## 2021-02-22 MED ORDER — COLCHICINE 0.6 MG PO TABS: 0.6000 mg | ORAL_TABLET | Freq: Every day | ORAL | 1 refills | Status: DC

## 2021-02-22 MED ORDER — PANTOPRAZOLE SODIUM 40 MG PO TBEC: 40.0000 mg | DELAYED_RELEASE_TABLET | Freq: Every day | ORAL | 3 refills | Status: DC

## 2021-02-22 NOTE — Progress Notes (Signed)
Established Patient Office Visit  Subjective:  Patient ID: Gary Case, male    DOB: 1946-01-10  Age: 75 y.o. MRN: [id number]  CC:  Chief Complaint  Patient presents with  . Medication Problem    Patient would like to discuss medication.     HPI Gary Case presents for for follow-up of gout, osteoarthritis, GERD and a review of legal documents pertaining to his Will.  Reflux is controlled with the Protonix.  He tells that he has been taking a higher dose of allopurinol and then decided at some point because he had not had attack, he could lower the dose back to that which he had originally been taking which was 100 mg daily.  After some time he developed pains in his feet and right thumb.  Past Medical History:  Diagnosis Date  . Acute gastritis 08/03/2020  . Depression   . Elevated blood sugar 05/01/2018  . Elevated cholesterol 05/01/2018  . Gastroesophageal reflux disease 08/03/2020  . GERD (gastroesophageal reflux disease)   . Gout 01/01/2018  . Laceration of left upper extremity 05/01/2018  . Screen for colon cancer 01/01/2018  . Viral upper respiratory tract infection 01/01/2018    Past Surgical History:  Procedure Laterality Date  . NO PAST SURGERIES      Family History  Problem Relation Age of Onset  . Arthritis Mother   . Cancer Mother   . Early death Mother   . Alcohol abuse Father   . Diabetes Father   . Hypertension Father   . Kidney disease Father     Social History   Socioeconomic History  . Marital status: Divorced    Spouse name: Not on file  . Number of children: Not on file  . Years of education: Not on file  . Highest education level: Not on file  Occupational History  . Not on file  Tobacco Use  . Smoking status: Former Games developer  . Smokeless tobacco: Never Used  Substance and Sexual Activity  . Alcohol use: No  . Drug use: Not on file  . Sexual activity: Not on file  Other Topics Concern  . Not on file  Social History Narrative   . Not on file   Social Determinants of Health   Financial Resource Strain: Low Risk   . Difficulty of Paying Living Expenses: Not hard at all  Food Insecurity: No Food Insecurity  . Worried About Programme researcher, broadcasting/film/video in the Last Year: Never true  . Ran Out of Food in the Last Year: Never true  Transportation Needs: No Transportation Needs  . Lack of Transportation (Medical): No  . Lack of Transportation (Non-Medical): No  Physical Activity: Sufficiently Active  . Days of Exercise per Week: 5 days  . Minutes of Exercise per Session: 30 min  Stress: No Stress Concern Present  . Feeling of Stress : Not at all  Social Connections: Moderately Isolated  . Frequency of Communication with Friends and Family: More than three times a week  . Frequency of Social Gatherings with Friends and Family: Once a week  . Attends Religious Services: More than 4 times per year  . Active Member of Clubs or Organizations: No  . Attends Banker Meetings: Never  . Marital Status: Divorced  Catering manager Violence: Not At Risk  . Fear of Current or Ex-Partner: No  . Emotionally Abused: No  . Physically Abused: No  . Sexually Abused: No    Outpatient Medications Prior to Visit  Medication Sig Dispense Refill  . aspirin EC 81 MG tablet Take 1 tablet (81 mg total) by mouth daily. 365 tablet 1  . allopurinol (ZYLOPRIM) 100 MG tablet TAKE ONE TABLET BY MOUTH ONCE DAILY 90 tablet 1  . pantoprazole (PROTONIX) 40 MG tablet Take 1 tablet (40 mg total) by mouth daily. 30 tablet 3  . meloxicam (MOBIC) 7.5 MG tablet TAKE 1 TABLET BY MOUTH ONCE DAILY WITH FOOD ONLY AS NEEDED (Patient not taking: Reported on 02/22/2021) 30 tablet 0  . colchicine 0.6 MG tablet TAKE ONE TABLET BY MOUTH TWICE DAILY. take FOR 7-10 DAYS AS NEEDED FOR flares 30 tablet 1   No facility-administered medications prior to visit.    No Known Allergies  ROS Review of Systems  Constitutional: Negative.   HENT: Negative.    Eyes: Negative for photophobia and visual disturbance.  Respiratory: Negative.   Cardiovascular: Negative.   Gastrointestinal: Negative.   Endocrine: Negative for polyphagia and polyuria.  Genitourinary: Negative.   Musculoskeletal: Positive for arthralgias.  Neurological: Negative for speech difficulty and weakness.  Hematological: Negative.   Psychiatric/Behavioral: Negative.       Objective:    Physical Exam Vitals and nursing note reviewed.  Constitutional:      General: He is not in acute distress.    Appearance: Normal appearance. He is normal weight. He is not ill-appearing, toxic-appearing or diaphoretic.  HENT:     Head: Normocephalic and atraumatic.     Right Ear: External ear normal.     Left Ear: External ear normal.     Mouth/Throat:     Mouth: Mucous membranes are dry.     Pharynx: Oropharynx is clear.  Eyes:     General: No scleral icterus.       Right eye: No discharge.        Left eye: No discharge.     Extraocular Movements: Extraocular movements intact.     Conjunctiva/sclera: Conjunctivae normal.  Cardiovascular:     Rate and Rhythm: Normal rate and regular rhythm.  Pulmonary:     Effort: Pulmonary effort is normal.     Breath sounds: Normal breath sounds.  Musculoskeletal:     Left lower leg: No edema.     Left foot: Normal range of motion. No swelling.       Legs:  Skin:    General: Skin is warm and dry.  Neurological:     Mental Status: He is alert and oriented to person, place, and time.  Psychiatric:        Mood and Affect: Mood normal.        Behavior: Behavior normal.     BP 126/84   Pulse (!) 52   Temp (!) 97.4 F (36.3 C) (Temporal)   Ht 5\' 5"  (1.651 m)   Wt 178 lb (80.7 kg)   SpO2 97%   BMI 29.62 kg/m  Wt Readings from Last 3 Encounters:  02/22/21 178 lb (80.7 kg)  09/17/20 178 lb (80.7 kg)  08/25/20 178 lb (80.7 kg)     There are no preventive care reminders to display for this patient.  There are no preventive  care reminders to display for this patient.  No results found for: TSH Lab Results  Component Value Date   WBC 6.3 08/05/2020   HGB 15.4 08/05/2020   HCT 44.6 08/05/2020   MCV 90.4 08/05/2020   PLT 245.0 08/05/2020   Lab Results  Component Value Date   NA 142 08/05/2020  K 5.1 08/05/2020   CO2 31 08/05/2020   GLUCOSE 98 08/05/2020   BUN 14 08/05/2020   CREATININE 0.96 08/05/2020   BILITOT 0.8 08/05/2020   ALKPHOS 77 08/05/2020   AST 14 08/05/2020   ALT 13 08/05/2020   PROT 6.7 08/05/2020   ALBUMIN 4.0 08/05/2020   CALCIUM 9.3 08/05/2020   GFR 76.08 08/05/2020   Lab Results  Component Value Date   CHOL 161 08/05/2020   Lab Results  Component Value Date   HDL 44.60 08/05/2020   Lab Results  Component Value Date   LDLCALC 97 08/05/2020   Lab Results  Component Value Date   TRIG 98.0 08/05/2020   Lab Results  Component Value Date   CHOLHDL 4 08/05/2020   Lab Results  Component Value Date   HGBA1C 5.9 05/02/2018      Assessment & Plan:   Problem List Items Addressed This Visit      Digestive   Gastroesophageal reflux disease   Relevant Medications   pantoprazole (PROTONIX) 40 MG tablet   Acute gastritis   Relevant Medications   pantoprazole (PROTONIX) 40 MG tablet     Other   Gout - Primary   Relevant Medications   allopurinol (ZYLOPRIM) 100 MG tablet   colchicine 0.6 MG tablet   Other Relevant Orders   Comprehensive metabolic panel   Uric acid    Other Visit Diagnoses    Arthritis due to gout       Relevant Medications   allopurinol (ZYLOPRIM) 100 MG tablet   colchicine 0.6 MG tablet      Meds ordered this encounter  Medications  . allopurinol (ZYLOPRIM) 100 MG tablet    Sig: Take 1 tablet (100 mg total) by mouth daily.    Dispense:  90 tablet    Refill:  1  . colchicine 0.6 MG tablet    Sig: Take 1 tablet (0.6 mg total) by mouth daily.    Dispense:  90 tablet    Refill:  1  . pantoprazole (PROTONIX) 40 MG tablet    Sig: Take  1 tablet (40 mg total) by mouth daily.    Dispense:  90 tablet    Refill:  3    Follow-up: Return in about 3 months (around 05/24/2021).   Uric acid level drawn today.  We will start over.  100 mg of allopurinol with 1 colchicine taken daily.  May use Tylenol arthritis for pains.  May increase allopurinol on follow-up in 3 months pending patient's progress.  Discussed end-of-life care under the circumstances of the patient being inflected by an incurable and terminal illness.  [name], MD

## 2021-02-25 ENCOUNTER — Other Ambulatory Visit: Payer: Self-pay

## 2021-02-25 DIAGNOSIS — M109 Gout, unspecified: Secondary | ICD-10-CM

## 2021-02-25 MED ORDER — MELOXICAM 7.5 MG PO TABS: ORAL_TABLET | ORAL | 0 refills | Status: DC

## 2021-03-01 ENCOUNTER — Other Ambulatory Visit: Payer: Self-pay | Admitting: Family

## 2021-03-01 DIAGNOSIS — K2900 Acute gastritis without bleeding: Secondary | ICD-10-CM

## 2021-03-01 DIAGNOSIS — K219 Gastro-esophageal reflux disease without esophagitis: Secondary | ICD-10-CM

## 2021-04-05 DIAGNOSIS — M9904 Segmental and somatic dysfunction of sacral region: Secondary | ICD-10-CM | POA: Diagnosis not present

## 2021-04-05 DIAGNOSIS — M9903 Segmental and somatic dysfunction of lumbar region: Secondary | ICD-10-CM | POA: Diagnosis not present

## 2021-04-05 DIAGNOSIS — M62830 Muscle spasm of back: Secondary | ICD-10-CM | POA: Diagnosis not present

## 2021-04-05 DIAGNOSIS — M5441 Lumbago with sciatica, right side: Secondary | ICD-10-CM | POA: Diagnosis not present

## 2021-04-09 DIAGNOSIS — M62830 Muscle spasm of back: Secondary | ICD-10-CM | POA: Diagnosis not present

## 2021-04-09 DIAGNOSIS — M9904 Segmental and somatic dysfunction of sacral region: Secondary | ICD-10-CM | POA: Diagnosis not present

## 2021-04-09 DIAGNOSIS — M5441 Lumbago with sciatica, right side: Secondary | ICD-10-CM | POA: Diagnosis not present

## 2021-04-09 DIAGNOSIS — M9903 Segmental and somatic dysfunction of lumbar region: Secondary | ICD-10-CM | POA: Diagnosis not present

## 2021-04-13 ENCOUNTER — Telehealth: Payer: Self-pay

## 2021-04-13 DIAGNOSIS — M109 Gout, unspecified: Secondary | ICD-10-CM

## 2021-04-13 MED ORDER — MELOXICAM 15 MG PO TABS
15.0000 mg | ORAL_TABLET | Freq: Every day | ORAL | 2 refills | Status: DC
Start: 2021-04-13 — End: 2021-05-31

## 2021-04-13 NOTE — Telephone Encounter (Signed)
Patient calling states that the medication that he was put on for arthritis does not seem to be helping he would like to know if there is something else he could take or if dosage for Meloxicam could be increased. Per patient he sometimes can not walk due to arthritis he's also taking Colchicine for gout. Please advise.

## 2021-04-14 NOTE — Telephone Encounter (Signed)
Patient notified of providers instructions and will pick up new Rx today form the pharmacy.  Dm/cma

## 2021-04-16 ENCOUNTER — Telehealth: Payer: Self-pay

## 2021-04-16 DIAGNOSIS — M9904 Segmental and somatic dysfunction of sacral region: Secondary | ICD-10-CM | POA: Diagnosis not present

## 2021-04-16 DIAGNOSIS — M9903 Segmental and somatic dysfunction of lumbar region: Secondary | ICD-10-CM | POA: Diagnosis not present

## 2021-04-16 DIAGNOSIS — M5441 Lumbago with sciatica, right side: Secondary | ICD-10-CM | POA: Diagnosis not present

## 2021-04-16 DIAGNOSIS — M62830 Muscle spasm of back: Secondary | ICD-10-CM | POA: Diagnosis not present

## 2021-04-16 NOTE — Progress Notes (Signed)
Reached out to patient to confirm if he switch back to [facility] pharmacy or if he is with [facility].   Patient states he  would like to switch back to [facility] pharmacy services, and  cancel CCM enrollment. Alex flurry CPP notified to follow up with patient.  Everlean Cherry Clinical Pharmacist Assistant [phone]

## 2021-04-22 DIAGNOSIS — M9903 Segmental and somatic dysfunction of lumbar region: Secondary | ICD-10-CM | POA: Diagnosis not present

## 2021-04-22 DIAGNOSIS — M62830 Muscle spasm of back: Secondary | ICD-10-CM | POA: Diagnosis not present

## 2021-04-22 DIAGNOSIS — M9904 Segmental and somatic dysfunction of sacral region: Secondary | ICD-10-CM | POA: Diagnosis not present

## 2021-04-22 DIAGNOSIS — M5441 Lumbago with sciatica, right side: Secondary | ICD-10-CM | POA: Diagnosis not present

## 2021-05-24 ENCOUNTER — Telehealth: Payer: PPO

## 2021-05-24 ENCOUNTER — Other Ambulatory Visit: Payer: Self-pay

## 2021-05-25 ENCOUNTER — Ambulatory Visit (INDEPENDENT_AMBULATORY_CARE_PROVIDER_SITE_OTHER): Payer: PPO | Admitting: Family Medicine

## 2021-05-25 ENCOUNTER — Encounter: Payer: Self-pay | Admitting: Family Medicine

## 2021-05-25 ENCOUNTER — Telehealth: Payer: Self-pay

## 2021-05-25 VITALS — BP 118/68 | HR 65 | Temp 97.6°F | Ht 65.0 in | Wt 173.0 lb

## 2021-05-25 DIAGNOSIS — K219 Gastro-esophageal reflux disease without esophagitis: Secondary | ICD-10-CM

## 2021-05-25 DIAGNOSIS — M109 Gout, unspecified: Secondary | ICD-10-CM

## 2021-05-25 LAB — BASIC METABOLIC PANEL
BUN: 19 mg/dL (ref 6–23)
CO2: 30 mEq/L (ref 19–32)
Calcium: 9.4 mg/dL (ref 8.4–10.5)
Chloride: 105 mEq/L (ref 96–112)
Creatinine, Ser: 0.9 mg/dL (ref 0.40–1.50)
GFR: 82.57 mL/min (ref >60.00)
Glucose, Bld: 101 mg/dL — ABNORMAL HIGH (ref 70–99)
Potassium: 4.3 mEq/L (ref 3.5–5.1)
Sodium: 140 mEq/L (ref 135–145)

## 2021-05-25 LAB — URIC ACID: Uric Acid, Serum: 5.8 mg/dL (ref 4.0–7.8)

## 2021-05-25 MED ORDER — COLCHICINE 0.6 MG PO TABS: 0.6000 mg | ORAL_TABLET | Freq: Two times a day (BID) | ORAL | 3 refills | Status: DC

## 2021-05-25 NOTE — Progress Notes (Signed)
Sent message to scheduler to cancel patient appointment that is schedule with the clinical pharmacist on 05/31/2021. Per note on 04/16/2021 patient decided to cancel CCM services.  Everlean Cherry Clinical Pharmacist Assistant [phone]

## 2021-05-25 NOTE — Progress Notes (Addendum)
Established Patient Office Visit  Subjective:  Patient ID: Gary Case, male    DOB: Feb 04, 1946  Age: 75 y.o. MRN: [id number]  CC:  Chief Complaint  Patient presents with   Follow-up    3 month follow up on gout and reflux, per patient no flare ups, no concerns.     HPI Gary Case presents for follow-up of gout generalized arthritis.  Status post restarting allopurinol 100 mg with daily colchicine patient has had no gouty attacks.  He is having no issues with reflux with the Protonix.  He uses the meloxicam as needed for arthritic aches and pains.  He typically uses it 2-3 times weekly.  No issues with meloxicam.  He has recently been married and moved in the last 3 months.  He has never had a heart attack or stroke to his knowledge.  Past Medical History:  Diagnosis Date   Acute gastritis 08/03/2020   Depression    Elevated blood sugar 05/01/2018   Elevated cholesterol 05/01/2018   Gastroesophageal reflux disease 08/03/2020   GERD (gastroesophageal reflux disease)    Gout 01/01/2018   Laceration of left upper extremity 05/01/2018   Screen for colon cancer 01/01/2018   Viral upper respiratory tract infection 01/01/2018    Past Surgical History:  Procedure Laterality Date   NO PAST SURGERIES      Family History  Problem Relation Age of Onset   Arthritis Mother    Cancer Mother    Early death Mother    Alcohol abuse Father    Diabetes Father    Hypertension Father    Kidney disease Father     Social History   Socioeconomic History   Marital status: Divorced    Spouse name: Not on file   Number of children: Not on file   Years of education: Not on file   Highest education level: Not on file  Occupational History   Not on file  Tobacco Use   Smoking status: Former   Smokeless tobacco: Never  Substance and Sexual Activity   Alcohol use: No   Drug use: Not on file   Sexual activity: Not on file  Other Topics Concern   Not on file  Social History  Narrative   Not on file   Social Determinants of Health   Financial Resource Strain: Low Risk    Difficulty of Paying Living Expenses: Not hard at all  Food Insecurity: No Food Insecurity   Worried About Programme researcher, broadcasting/film/video in the Last Year: Never true   Barista in the Last Year: Never true  Transportation Needs: No Transportation Needs   Lack of Transportation (Medical): No   Lack of Transportation (Non-Medical): No  Physical Activity: Sufficiently Active   Days of Exercise per Week: 5 days   Minutes of Exercise per Session: 30 min  Stress: No Stress Concern Present   Feeling of Stress : Not at all  Social Connections: Moderately Isolated   Frequency of Communication with Friends and Family: More than three times a week   Frequency of Social Gatherings with Friends and Family: Once a week   Attends Religious Services: More than 4 times per year   Active Member of [employer] or Organizations: No   Attends Banker Meetings: Never   Marital Status: Divorced  Catering manager Violence: Not At Risk   Fear of Current or Ex-Partner: No   Emotionally Abused: No   Physically Abused: No   Sexually Abused: No  Outpatient Medications Prior to Visit  Medication Sig Dispense Refill   pantoprazole (PROTONIX) 40 MG tablet Take 1 tablet (40 mg total) by mouth daily. 90 tablet 3   allopurinol (ZYLOPRIM) 100 MG tablet Take 1 tablet (100 mg total) by mouth daily. 90 tablet 1   aspirin EC 81 MG tablet Take 1 tablet (81 mg total) by mouth daily. 365 tablet 1   colchicine 0.6 MG tablet Take 1 tablet (0.6 mg total) by mouth daily. 90 tablet 1   meloxicam (MOBIC) 15 MG tablet Take 1 tablet (15 mg total) by mouth daily. (Patient taking differently: Take 15 mg by mouth as needed.) 30 tablet 2   No facility-administered medications prior to visit.    No Known Allergies  ROS Review of Systems  Constitutional: Negative.   HENT: Negative.    Eyes:  Negative for photophobia and  visual disturbance.  Respiratory: Negative.    Cardiovascular: Negative.   Gastrointestinal: Negative.   Musculoskeletal:  Negative for arthralgias and joint swelling.  Neurological:  Negative for speech difficulty and weakness.  Psychiatric/Behavioral: Negative.       Objective:    Physical Exam Vitals and nursing note reviewed.  Constitutional:      General: He is not in acute distress.    Appearance: Normal appearance. He is not ill-appearing, toxic-appearing or diaphoretic.  HENT:     Head: Normocephalic and atraumatic.     Right Ear: External ear normal.     Left Ear: External ear normal.  Eyes:     General: No scleral icterus.       Right eye: No discharge.        Left eye: No discharge.     Conjunctiva/sclera: Conjunctivae normal.  Neck:     Vascular: No carotid bruit.  Cardiovascular:     Rate and Rhythm: Normal rate and regular rhythm.  Pulmonary:     Effort: Pulmonary effort is normal.     Breath sounds: Normal breath sounds.  Musculoskeletal:     Cervical back: No rigidity or tenderness.  Lymphadenopathy:     Cervical: No cervical adenopathy.  Skin:    General: Skin is warm and dry.  Neurological:     Mental Status: He is alert and oriented to person, place, and time.  Psychiatric:        Mood and Affect: Mood normal.        Behavior: Behavior normal.    BP 118/68   Pulse 65   Temp 97.6 F (36.4 C) (Temporal)   Ht 5\' 5"  (1.651 m)   Wt 173 lb (78.5 kg)   SpO2 97%   BMI 28.79 kg/m  Wt Readings from Last 3 Encounters:  05/25/21 173 lb (78.5 kg)  02/22/21 178 lb (80.7 kg)  09/17/20 178 lb (80.7 kg)     Health Maintenance Due  Topic Date Due   Zoster Vaccines- Shingrix (1 of 2) Never done   COVID-19 Vaccine (4 - Booster for Pfizer series) 01/20/2021    There are no preventive care reminders to display for this patient.  No results found for: TSH Lab Results  Component Value Date   WBC 6.3 08/05/2020   HGB 15.4 08/05/2020   HCT 44.6  08/05/2020   MCV 90.4 08/05/2020   PLT 245.0 08/05/2020   Lab Results  Component Value Date   NA 140 05/25/2021   K 4.3 05/25/2021   CO2 30 05/25/2021   GLUCOSE 101 (H) 05/25/2021   BUN 19 05/25/2021  CREATININE 0.90 05/25/2021   BILITOT 0.9 02/22/2021   ALKPHOS 74 02/22/2021   AST 16 02/22/2021   ALT 16 02/22/2021   PROT 6.7 02/22/2021   ALBUMIN 4.1 02/22/2021   CALCIUM 9.4 05/25/2021   GFR 82.57 05/25/2021   Lab Results  Component Value Date   CHOL 161 08/05/2020   Lab Results  Component Value Date   HDL 44.60 08/05/2020   Lab Results  Component Value Date   LDLCALC 97 08/05/2020   Lab Results  Component Value Date   TRIG 98.0 08/05/2020   Lab Results  Component Value Date   CHOLHDL 4 08/05/2020   Lab Results  Component Value Date   HGBA1C 5.9 05/02/2018      Assessment & Plan:   Problem List Items Addressed This Visit       Digestive   Gastroesophageal reflux disease     Other   Gout - Primary   Relevant Medications   colchicine 0.6 MG tablet   allopurinol (ZYLOPRIM) 100 MG tablet   meloxicam (MOBIC) 15 MG tablet   Other Relevant Orders   Uric acid (Completed)   Basic metabolic panel (Completed)   Other Visit Diagnoses     Arthritis due to gout       Relevant Medications   colchicine 0.6 MG tablet   allopurinol (ZYLOPRIM) 100 MG tablet   meloxicam (MOBIC) 15 MG tablet       Meds ordered this encounter  Medications   DISCONTD: colchicine 0.6 MG tablet    Sig: Take 1 tablet (0.6 mg total) by mouth 2 (two) times daily. For 7 days as needed for attacks.    Dispense:  30 tablet    Refill:  3   colchicine 0.6 MG tablet    Sig: Take 1 tablet (0.6 mg total) by mouth 2 (two) times daily. For 7 days as needed for attacks.    Dispense:  60 tablet    Refill:  3   allopurinol (ZYLOPRIM) 100 MG tablet    Sig: Take 1 tablet (100 mg total) by mouth daily.    Dispense:  90 tablet    Refill:  1   meloxicam (MOBIC) 15 MG tablet    Sig: Take  1 tablet (15 mg total) by mouth as needed.    Dispense:  90 tablet    Refill:  1     Follow-up: Return in about 3 months (around 08/25/2021).  Hopefully will be able to continue the lower dose of allopurinol.  Uric acid level is pending.  He will now use colchicine as needed for attacks.  He will continue taking the meloxicam as needed for arthritic aches and pains as well as orthotics.  Follow-up in 3 months for recheck of his cholesterol.  Advised that he may discontinue the aspirin.  [name], MD

## 2021-05-31 ENCOUNTER — Encounter: Payer: Self-pay | Admitting: Family Medicine

## 2021-05-31 ENCOUNTER — Telehealth: Payer: PPO

## 2021-05-31 MED ORDER — MELOXICAM 15 MG PO TABS: 15.0000 mg | ORAL_TABLET | ORAL | 1 refills | Status: AC | PRN

## 2021-05-31 MED ORDER — ALLOPURINOL 100 MG PO TABS: 100.0000 mg | ORAL_TABLET | Freq: Every day | ORAL | 1 refills | Status: DC

## 2021-05-31 MED ORDER — COLCHICINE 0.6 MG PO TABS: 0.6000 mg | ORAL_TABLET | Freq: Two times a day (BID) | ORAL | 3 refills | Status: DC

## 2021-05-31 NOTE — Addendum Note (Signed)
Addended by: Nadene Rubins A on: 05/31/2021 10:05 AM   Modules accepted: Orders

## 2021-06-14 DIAGNOSIS — M9904 Segmental and somatic dysfunction of sacral region: Secondary | ICD-10-CM | POA: Diagnosis not present

## 2021-06-14 DIAGNOSIS — M5441 Lumbago with sciatica, right side: Secondary | ICD-10-CM | POA: Diagnosis not present

## 2021-06-14 DIAGNOSIS — M9903 Segmental and somatic dysfunction of lumbar region: Secondary | ICD-10-CM | POA: Diagnosis not present

## 2021-06-14 DIAGNOSIS — M62830 Muscle spasm of back: Secondary | ICD-10-CM | POA: Diagnosis not present

## 2021-07-14 DIAGNOSIS — M62830 Muscle spasm of back: Secondary | ICD-10-CM | POA: Diagnosis not present

## 2021-07-14 DIAGNOSIS — M9904 Segmental and somatic dysfunction of sacral region: Secondary | ICD-10-CM | POA: Diagnosis not present

## 2021-07-14 DIAGNOSIS — M9903 Segmental and somatic dysfunction of lumbar region: Secondary | ICD-10-CM | POA: Diagnosis not present

## 2021-07-14 DIAGNOSIS — M5441 Lumbago with sciatica, right side: Secondary | ICD-10-CM | POA: Diagnosis not present

## 2021-11-23 ENCOUNTER — Ambulatory Visit (INDEPENDENT_AMBULATORY_CARE_PROVIDER_SITE_OTHER): Payer: PPO

## 2021-11-23 DIAGNOSIS — Z0000 Encounter for general adult medical examination without abnormal findings: Secondary | ICD-10-CM

## 2021-11-23 NOTE — Patient Instructions (Signed)
Gary Case , Thank you for taking time to come for your Medicare Wellness Visit. I appreciate your ongoing commitment to your health goals. Please review the following plan we discussed and let me know if I can assist you in the future.   Screening recommendations/referrals: Colonoscopy: no longer required  Recommended yearly ophthalmology/optometry visit for glaucoma screening and checkup Recommended yearly dental visit for hygiene and checkup  Vaccinations: Influenza vaccine: declined  Pneumococcal vaccine: completed  Tdap vaccine: 05/01/2018 Shingles vaccine: will consider     Advanced directives: yes   Conditions/risks identified: none   Next appointment: none   Preventive Care 65 Years and Older, Male Preventive care refers to lifestyle choices and visits with your health care provider that can promote health and wellness. What does preventive care include? A yearly physical exam. This is also called an annual well check. Dental exams once or twice a year. Routine eye exams. Ask your health care provider how often you should have your eyes checked. Personal lifestyle choices, including: Daily care of your teeth and gums. Regular physical activity. Eating a healthy diet. Avoiding tobacco and drug use. Limiting alcohol use. Practicing safe sex. Taking low doses of aspirin every day. Taking vitamin and mineral supplements as recommended by your health care provider. What happens during an annual well check? The services and screenings done by your health care provider during your annual well check will depend on your age, overall health, lifestyle risk factors, and family history of disease. Counseling  Your health care provider may ask you questions about your: Alcohol use. Tobacco use. Drug use. Emotional well-being. Home and relationship well-being. Sexual activity. Eating habits. History of falls. Memory and ability to understand (cognition). Work and work  Astronomer. Screening  You may have the following tests or measurements: Height, weight, and BMI. Blood pressure. Lipid and cholesterol levels. These may be checked every 5 years, or more frequently if you are over 52 years old. Skin check. Lung cancer screening. You may have this screening every year starting at age 74 if you have a 30-pack-year history of smoking and currently smoke or have quit within the past 15 years. Fecal occult blood test (FOBT) of the stool. You may have this test every year starting at age 52. Flexible sigmoidoscopy or colonoscopy. You may have a sigmoidoscopy every 5 years or a colonoscopy every 10 years starting at age 47. Prostate cancer screening. Recommendations will vary depending on your family history and other risks. Hepatitis C blood test. Hepatitis B blood test. Sexually transmitted disease (STD) testing. Diabetes screening. This is done by checking your blood sugar (glucose) after you have not eaten for a while (fasting). You may have this done every 1-3 years. Abdominal aortic aneurysm (AAA) screening. You may need this if you are a current or former smoker. Osteoporosis. You may be screened starting at age 36 if you are at high risk. Talk with your health care provider about your test results, treatment options, and if necessary, the need for more tests. Vaccines  Your health care provider may recommend certain vaccines, such as: Influenza vaccine. This is recommended every year. Tetanus, diphtheria, and acellular pertussis (Tdap, Td) vaccine. You may need a Td booster every 10 years. Zoster vaccine. You may need this after age 50. Pneumococcal 13-valent conjugate (PCV13) vaccine. One dose is recommended after age 85. Pneumococcal polysaccharide (PPSV23) vaccine. One dose is recommended after age 10. Talk to your health care provider about which screenings and vaccines you need and  how often you need them. This information is not intended to replace  advice given to you by your health care provider. Make sure you discuss any questions you have with your health care provider. Document Released: 11/27/2015 Document Revised: 07/20/2016 Document Reviewed: 09/01/2015 Elsevier Interactive Patient Education  2017 [place] Prevention in the Home Falls can cause injuries. They can happen to people of all ages. There are many things you can do to make your home safe and to help prevent falls. What can I do on the outside of my home? Regularly fix the edges of walkways and driveways and fix any cracks. Remove anything that might make you trip as you walk through a door, such as a raised step or threshold. Trim any bushes or trees on the path to your home. Use bright outdoor lighting. Clear any walking paths of anything that might make someone trip, such as rocks or tools. Regularly check to see if handrails are loose or broken. Make sure that both sides of any steps have handrails. Any raised decks and porches should have guardrails on the edges. Have any leaves, snow, or ice cleared regularly. Use sand or salt on walking paths during winter. Clean up any spills in your garage right away. This includes oil or grease spills. What can I do in the bathroom? Use night lights. Install grab bars by the toilet and in the tub and shower. Do not use towel bars as grab bars. Use non-skid mats or decals in the tub or shower. If you need to sit down in the shower, use a plastic, non-slip stool. Keep the floor dry. Clean up any water that spills on the floor as soon as it happens. Remove soap buildup in the tub or shower regularly. Attach bath mats securely with double-sided non-slip rug tape. Do not have throw rugs and other things on the floor that can make you trip. What can I do in the bedroom? Use night lights. Make sure that you have a light by your bed that is easy to reach. Do not use any sheets or blankets that are too big for your bed.  They should not hang down onto the floor. Have a firm chair that has side arms. You can use this for support while you get dressed. Do not have throw rugs and other things on the floor that can make you trip. What can I do in the kitchen? Clean up any spills right away. Avoid walking on wet floors. Keep items that you use a lot in easy-to-reach places. If you need to reach something above you, use a strong step stool that has a grab bar. Keep electrical cords out of the way. Do not use floor polish or wax that makes floors slippery. If you must use wax, use non-skid floor wax. Do not have throw rugs and other things on the floor that can make you trip. What can I do with my stairs? Do not leave any items on the stairs. Make sure that there are handrails on both sides of the stairs and use them. Fix handrails that are broken or loose. Make sure that handrails are as long as the stairways. Check any carpeting to make sure that it is firmly attached to the stairs. Fix any carpet that is loose or worn. Avoid having throw rugs at the top or bottom of the stairs. If you do have throw rugs, attach them to the floor with carpet tape. Make sure that you have  a light switch at the top of the stairs and the bottom of the stairs. If you do not have them, ask someone to add them for you. What else can I do to help prevent falls? Wear shoes that: Do not have high heels. Have rubber bottoms. Are comfortable and fit you well. Are closed at the toe. Do not wear sandals. If you use a stepladder: Make sure that it is fully opened. Do not climb a closed stepladder. Make sure that both sides of the stepladder are locked into place. Ask someone to hold it for you, if possible. Clearly mark and make sure that you can see: Any grab bars or handrails. First and last steps. Where the edge of each step is. Use tools that help you move around (mobility aids) if they are needed. These  include: Canes. Walkers. Scooters. Crutches. Turn on the lights when you go into a dark area. Replace any light bulbs as soon as they burn out. Set up your furniture so you have a clear path. Avoid moving your furniture around. If any of your floors are uneven, fix them. If there are any pets around you, be aware of where they are. Review your medicines with your doctor. Some medicines can make you feel dizzy. This can increase your chance of falling. Ask your doctor what other things that you can do to help prevent falls. This information is not intended to replace advice given to you by your health care provider. Make sure you discuss any questions you have with your health care provider. Document Released: 08/27/2009 Document Revised: 04/07/2016 Document Reviewed: 12/05/2014 Elsevier Interactive Patient Education  2017 [employer].

## 2021-11-23 NOTE — Progress Notes (Signed)
Subjective:   Gary Case is a 76 y.o. male who presents for an Subsequent Medicare Annual Wellness Visit.   I connected with [name] today by telephone and verified that I am speaking with the correct person using two identifiers. Location patient: home Location provider: work Persons participating in the virtual visit: patient, provider.   I discussed the limitations, risks, security and privacy concerns of performing an evaluation and management service by telephone and the availability of in person appointments. I also discussed with the patient that there may be a patient responsible charge related to this service. The patient expressed understanding and verbally consented to this telephonic visit.    Interactive audio and video telecommunications were attempted between this provider and patient, however failed, due to patient having technical difficulties OR patient did not have access to video capability.  We continued and completed visit with audio only.    Review of Systems     Cardiac Risk Factors include: advanced age (>58men, >5 women);male gender     Objective:    Today's Vitals   There is no height or weight on file to calculate BMI.  Advanced Directives 11/23/2021 09/17/2020  Does Patient Have a Medical Advance Directive? Yes No  Type of Estate agent of [place];Living will -  Copy of Healthcare Power of Attorney in Chart? No - copy requested -  Would patient like information on creating a medical advance directive? - No - Patient declined    Current Medications (verified) Outpatient Encounter Medications as of 11/23/2021  Medication Sig   allopurinol (ZYLOPRIM) 100 MG tablet Take 1 tablet (100 mg total) by mouth daily.   colchicine 0.6 MG tablet Take 1 tablet (0.6 mg total) by mouth 2 (two) times daily. For 7 days as needed for attacks.   pantoprazole (PROTONIX) 40 MG tablet Take 1 tablet (40 mg total) by mouth daily.   No  facility-administered encounter medications on file as of 11/23/2021.    Allergies (verified) Patient has no known allergies.   History: Past Medical History:  Diagnosis Date   Acute gastritis 08/03/2020   Depression    Elevated blood sugar 05/01/2018   Elevated cholesterol 05/01/2018   Gastroesophageal reflux disease 08/03/2020   GERD (gastroesophageal reflux disease)    Gout 01/01/2018   Laceration of left upper extremity 05/01/2018   Screen for colon cancer 01/01/2018   Viral upper respiratory tract infection 01/01/2018   Past Surgical History:  Procedure Laterality Date   NO PAST SURGERIES     Family History  Problem Relation Age of Onset   Arthritis Mother    Cancer Mother    Early death Mother    Alcohol abuse Father    Diabetes Father    Hypertension Father    Kidney disease Father    Social History   Socioeconomic History   Marital status: Divorced    Spouse name: Not on file   Number of children: Not on file   Years of education: Not on file   Highest education level: Not on file  Occupational History   Not on file  Tobacco Use   Smoking status: Former   Smokeless tobacco: Never  Substance and Sexual Activity   Alcohol use: No   Drug use: Not on file   Sexual activity: Not on file  Other Topics Concern   Not on file  Social History Narrative   Not on file   Social Determinants of Health   Financial Resource Strain: Low  Risk    Difficulty of Paying Living Expenses: Not hard at all  Food Insecurity: No Food Insecurity   Worried About Running Out of Food in the Last Year: Never true   Ran Out of Food in the Last Year: Never true  Transportation Needs: No Transportation Needs   Lack of Transportation (Medical): No   Lack of Transportation (Non-Medical): No  Physical Activity: Insufficiently Active   Days of Exercise per Week: 3 days   Minutes of Exercise per Session: 30 min  Stress: No Stress Concern Present   Feeling of Stress : Not at all  Social  Connections: Moderately Integrated   Frequency of Communication with Friends and Family: Twice a week   Frequency of Social Gatherings with Friends and Family: Twice a week   Attends Religious Services: More than 4 times per year   Active Member of [employer] or Organizations: No   Attends Engineer, structural: Never   Marital Status: Married    Tobacco Counseling Counseling given: Not Answered   Clinical Intake:  Pre-visit preparation completed: Yes  Pain : No/denies pain     Nutritional Risks: None Diabetes: No  How often do you need to have someone help you when you read instructions, pamphlets, or other written materials from your doctor or pharmacy?: 1 - Never What is the last grade level you completed in school?: Masters  Diabetic?no   Interpreter Needed?: No  Information entered by :: [name],LPN   Activities of Daily Living In your present state of health, do you have any difficulty performing the following activities: 11/23/2021  Hearing? N  Vision? N  Difficulty concentrating or making decisions? N  Walking or climbing stairs? N  Dressing or bathing? N  Doing errands, shopping? N  Preparing Food and eating ? N  Using the Toilet? N  In the past six months, have you accidently leaked urine? N  Do you have problems with loss of bowel control? N  Managing your Medications? N  Managing your Finances? N  Housekeeping or managing your Housekeeping? N  Some recent data might be hidden    Patient Care Team: [name], MD as PCP - General (Family Medicine) Gaspar Cola, [facility] as Pharmacist (Pharmacist)  Indicate any recent Medical Services you may have received from other than [facility] providers in the past year (date may be approximate).     Assessment:   This is a routine wellness examination for Gary Case.  Hearing/Vision screen Vision Screening - Comments:: Annual eye wears glasses  Dietary issues and exercise activities  discussed: Current Exercise Habits: Home exercise routine, Type of exercise: walking, Time (Minutes): 30, Frequency (Times/Week): 3, Weekly Exercise (Minutes/Week): 90, Intensity: Mild, Exercise limited by: None identified   Goals Addressed   None    Depression Screen PHQ 2/9 Scores 11/23/2021 11/23/2021 05/25/2021 02/22/2021 09/17/2020 12/12/2019  PHQ - 2 Score 0 0 0 0 0 0    Fall Risk Fall Risk  11/23/2021 05/25/2021 02/22/2021 09/17/2020 12/12/2019  Falls in the past year? 0 0 0 0 0  Comment - - - - -  Number falls in past yr: 0 - - 0 -  Injury with Fall? 0 - - 0 -  Follow up Falls evaluation completed - - Falls prevention discussed -    FALL RISK PREVENTION PERTAINING TO THE HOME:  Any stairs in or around the home? Yes  If so, are there any without handrails? No  Home free of loose throw rugs in walkways,  pet beds, electrical cords, etc? Yes  Adequate lighting in your home to reduce risk of falls? Yes   ASSISTIVE DEVICES UTILIZED TO PREVENT FALLS:  Life alert? No  Use of a cane, walker or w/c? No  Grab bars in the bathroom? No  Shower chair or bench in shower? No  Elevated toilet seat or a handicapped toilet? No    Cognitive Function:  Normal cognitive status assessed by direct observation by this Nurse Health Advisor. No abnormalities found.        Immunizations Immunization History  Administered Date(s) Administered   PFIZER(Purple Top)SARS-COV-2 Vaccination 01/13/2020, 02/03/2020, 09/22/2020   Pneumococcal Conjugate-13 04/04/2017   Pneumococcal Polysaccharide-23 05/01/2018   Tdap 05/01/2018    TDAP status: Up to date  Flu Vaccine status: Declined, Education has been provided regarding the importance of this vaccine but patient still declined. Advised may receive this vaccine at local pharmacy or Health Dept. Aware to provide a copy of the vaccination record if obtained from local pharmacy or Health Dept. Verbalized acceptance and understanding.  Pneumococcal vaccine  status: Up to date  Covid-19 vaccine status: Completed vaccines  Qualifies for Shingles Vaccine? Yes   Zostavax completed Yes   Shingrix Completed?: No.    Education has been provided regarding the importance of this vaccine. Patient has been advised to call insurance company to determine out of pocket expense if they have not yet received this vaccine. Advised may also receive vaccine at local pharmacy or Health Dept. Verbalized acceptance and understanding.  Screening Tests Health Maintenance  Topic Date Due   Zoster Vaccines- Shingrix (1 of 2) Never done   COVID-19 Vaccine (4 - Booster for Pfizer series) 11/17/2020   INFLUENZA VACCINE  Never done   TETANUS/TDAP  05/01/2028   Pneumonia Vaccine 69+ Years old  Completed   Hepatitis C Screening  Completed   HPV VACCINES  Aged Out   Fecal DNA (Cologuard)  Discontinued    Health Maintenance  Health Maintenance Due  Topic Date Due   Zoster Vaccines- Shingrix (1 of 2) Never done   COVID-19 Vaccine (4 - Booster for Pfizer series) 11/17/2020   INFLUENZA VACCINE  Never done    Colorectal cancer screening: No longer required.   Lung Cancer Screening: (Low Dose CT Chest recommended if Age 78-80 years, 30 pack-year currently smoking OR have quit w/in 15years.) does not qualify.   Lung Cancer Screening Referral: n/a  Additional Screening:  Hepatitis C Screening: does not qualify; Completed 04/04/2017  Vision Screening: Recommended annual ophthalmology exams for early detection of glaucoma and other disorders of the eye. Is the patient up to date with their annual eye exam?  Yes  Who is the provider or what is the name of the office in which the patient attends annual eye exams? [facility]  If pt is not established with a provider, would they like to be referred to a provider to establish care? No .   Dental Screening: Recommended annual dental exams for proper oral hygiene  Community Resource Referral / Chronic Care  Management: CRR required this visit?  No   CCM required this visit?  No      Plan:     I have personally reviewed and noted the following in the patients chart:   Medical and social history Use of alcohol, tobacco or illicit drugs  Current medications and supplements including opioid prescriptions. Patient is not currently taking opioid prescriptions. Functional ability and status Nutritional status Physical activity Advanced directives List  of other physicians Hospitalizations, surgeries, and ER visits in previous 12 months Vitals Screenings to include cognitive, depression, and falls Referrals and appointments  In addition, I have reviewed and discussed with patient certain preventive protocols, quality metrics, and best practice recommendations. A written personalized care plan for preventive services as well as general preventive health recommendations were provided to patient.     March Rummage, LPN   [id number]/[id number]   Nurse Notes: none

## 2021-12-07 ENCOUNTER — Telehealth: Payer: Self-pay | Admitting: Family Medicine

## 2021-12-07 NOTE — Telephone Encounter (Signed)
Appointment scheduled for follow up 

## 2021-12-09 DIAGNOSIS — H5213 Myopia, bilateral: Secondary | ICD-10-CM | POA: Diagnosis not present

## 2021-12-09 DIAGNOSIS — H2513 Age-related nuclear cataract, bilateral: Secondary | ICD-10-CM | POA: Diagnosis not present

## 2021-12-10 ENCOUNTER — Ambulatory Visit: Payer: PPO | Admitting: Family Medicine

## 2021-12-10 ENCOUNTER — Ambulatory Visit (INDEPENDENT_AMBULATORY_CARE_PROVIDER_SITE_OTHER): Payer: PPO | Admitting: Family Medicine

## 2021-12-10 ENCOUNTER — Other Ambulatory Visit: Payer: Self-pay

## 2021-12-10 ENCOUNTER — Encounter: Payer: Self-pay | Admitting: Family Medicine

## 2021-12-10 VITALS — BP 118/68 | HR 52 | Temp 97.6°F | Ht 65.0 in | Wt 179.6 lb

## 2021-12-10 DIAGNOSIS — T887XXA Unspecified adverse effect of drug or medicament, initial encounter: Secondary | ICD-10-CM | POA: Insufficient documentation

## 2021-12-10 DIAGNOSIS — M2550 Pain in unspecified joint: Secondary | ICD-10-CM

## 2021-12-10 DIAGNOSIS — M25552 Pain in left hip: Secondary | ICD-10-CM

## 2021-12-10 DIAGNOSIS — M25551 Pain in right hip: Secondary | ICD-10-CM | POA: Insufficient documentation

## 2021-12-10 DIAGNOSIS — M109 Gout, unspecified: Secondary | ICD-10-CM | POA: Diagnosis not present

## 2021-12-10 DIAGNOSIS — K219 Gastro-esophageal reflux disease without esophagitis: Secondary | ICD-10-CM

## 2021-12-10 DIAGNOSIS — Z0000 Encounter for general adult medical examination without abnormal findings: Secondary | ICD-10-CM

## 2021-12-10 NOTE — Progress Notes (Signed)
Established Patient Office Visit  Subjective:  Patient ID: Gary Case, male    DOB: 28-Aug-1946  Age: 76 y.o. MRN: [id number]  CC:  Chief Complaint  Patient presents with   Follow-up    Follow up on medications, no concerns. Patient not fasting.     HPI [name] presents for follow-up of gout, GERD, arthritis and health maintenance.  He is nonfasting today.  Uric acid levels are in the low end of normal on 100 mg of allopurinol.  He has not had a gouty attack in years.  Recalls that his dietary habits have changed.  He has been consuming a lower purine diet.  He does not drink alcohol.  Taking pantoprazole for GERD with relief.  Taking meloxicam most every day for various arthritic aches and pains.  Most of his pain seems to be in the left hip area.  Denies groin pain this is normally treated with meloxicam and chiropractic care.  He says that this is helpful.  Describes his hip pain as being in the left buttock area.  Past Medical History:  Diagnosis Date   Acute gastritis 08/03/2020   Depression    Elevated blood sugar 05/01/2018   Elevated cholesterol 05/01/2018   Gastroesophageal reflux disease 08/03/2020   GERD (gastroesophageal reflux disease)    Gout 01/01/2018   Laceration of left upper extremity 05/01/2018   Screen for colon cancer 01/01/2018   Viral upper respiratory tract infection 01/01/2018    Past Surgical History:  Procedure Laterality Date   NO PAST SURGERIES      Family History  Problem Relation Age of Onset   Arthritis Mother    Cancer Mother    Early death Mother    Alcohol abuse Father    Diabetes Father    Hypertension Father    Kidney disease Father     Social History   Socioeconomic History   Marital status: Divorced    Spouse name: Not on file   Number of children: Not on file   Years of education: Not on file   Highest education level: Not on file  Occupational History   Not on file  Tobacco Use   Smoking status: Former    Smokeless tobacco: Never  Substance and Sexual Activity   Alcohol use: No   Drug use: Not on file   Sexual activity: Not on file  Other Topics Concern   Not on file  Social History Narrative   Not on file   Social Determinants of Health   Financial Resource Strain: Low Risk    Difficulty of Paying Living Expenses: Not hard at all  Food Insecurity: No Food Insecurity   Worried About Programme researcher, broadcasting/film/video in the Last Year: Never true   Barista in the Last Year: Never true  Transportation Needs: No Transportation Needs   Lack of Transportation (Medical): No   Lack of Transportation (Non-Medical): No  Physical Activity: Insufficiently Active   Days of Exercise per Week: 3 days   Minutes of Exercise per Session: 30 min  Stress: No Stress Concern Present   Feeling of Stress : Not at all  Social Connections: Moderately Integrated   Frequency of Communication with Friends and Family: Twice a week   Frequency of Social Gatherings with Friends and Family: Twice a week   Attends Religious Services: More than 4 times per year   Active Member of [employer] or Organizations: No   Attends Banker Meetings: Never  Marital Status: Married  Human resources officer Violence: Not At Risk   Fear of Current or Ex-Partner: No   Emotionally Abused: No   Physically Abused: No   Sexually Abused: No    Outpatient Medications Prior to Visit  Medication Sig Dispense Refill   allopurinol (ZYLOPRIM) 100 MG tablet Take 1 tablet (100 mg total) by mouth daily. 90 tablet 1   colchicine 0.6 MG tablet Take 1 tablet (0.6 mg total) by mouth 2 (two) times daily. For 7 days as needed for attacks. 60 tablet 3   meloxicam (MOBIC) 15 MG tablet Take 15 mg by mouth daily as needed.     pantoprazole (PROTONIX) 40 MG tablet Take 1 tablet (40 mg total) by mouth daily. 90 tablet 3   No facility-administered medications prior to visit.    No Known Allergies  ROS Review of Systems  Constitutional:  Negative  for diaphoresis, fatigue, fever and unexpected weight change.  HENT: Negative.    Eyes:  Negative for photophobia and visual disturbance.  Respiratory: Negative.    Cardiovascular: Negative.   Gastrointestinal: Negative.   Endocrine: Negative for polyphagia and polyuria.  Genitourinary: Negative.   Musculoskeletal:  Positive for arthralgias.  Neurological:  Negative for speech difficulty, weakness and numbness.  Psychiatric/Behavioral: Negative.       Objective:    Physical Exam Vitals and nursing note reviewed.  Constitutional:      General: He is not in acute distress.    Appearance: Normal appearance. He is not ill-appearing, toxic-appearing or diaphoretic.  HENT:     Head: Normocephalic and atraumatic.     Right Ear: External ear normal.     Left Ear: External ear normal.  Eyes:     General:        Right eye: No discharge.        Left eye: No discharge.     Extraocular Movements: Extraocular movements intact.     Conjunctiva/sclera: Conjunctivae normal.     Pupils: Pupils are equal, round, and reactive to light.  Cardiovascular:     Rate and Rhythm: Normal rate and regular rhythm.  Pulmonary:     Effort: Pulmonary effort is normal.     Breath sounds: Normal breath sounds.  Abdominal:     General: Bowel sounds are normal.  Musculoskeletal:     Right hip: Normal.     Left hip: Decreased range of motion.       Legs:  Skin:    General: Skin is warm and dry.  Neurological:     Mental Status: He is alert and oriented to person, place, and time.  Psychiatric:        Mood and Affect: Mood normal.        Behavior: Behavior normal.    BP 118/68 (BP Location: Right Arm, Patient Position: Sitting, Cuff Size: Normal)    Pulse (!) 52    Temp 97.6 F (36.4 C) (Temporal)    Ht 5\' 5"  (1.651 m)    Wt 179 lb 9.6 oz (81.5 kg)    SpO2 97%    BMI 29.89 kg/m  Wt Readings from Last 3 Encounters:  12/10/21 179 lb 9.6 oz (81.5 kg)  05/25/21 173 lb (78.5 kg)  02/22/21 178 lb (80.7  kg)     Health Maintenance Due  Topic Date Due   Zoster Vaccines- Shingrix (1 of 2) Never done    There are no preventive care reminders to display for this patient.  No results found for: TSH  Lab Results  Component Value Date   WBC 6.3 08/05/2020   HGB 15.4 08/05/2020   HCT 44.6 08/05/2020   MCV 90.4 08/05/2020   PLT 245.0 08/05/2020   Lab Results  Component Value Date   NA 140 05/25/2021   K 4.3 05/25/2021   CO2 30 05/25/2021   GLUCOSE 101 (H) 05/25/2021   BUN 19 05/25/2021   CREATININE 0.90 05/25/2021   BILITOT 0.9 02/22/2021   ALKPHOS 74 02/22/2021   AST 16 02/22/2021   ALT 16 02/22/2021   PROT 6.7 02/22/2021   ALBUMIN 4.1 02/22/2021   CALCIUM 9.4 05/25/2021   GFR 82.57 05/25/2021   Lab Results  Component Value Date   CHOL 161 08/05/2020   Lab Results  Component Value Date   HDL 44.60 08/05/2020   Lab Results  Component Value Date   LDLCALC 97 08/05/2020   Lab Results  Component Value Date   TRIG 98.0 08/05/2020   Lab Results  Component Value Date   CHOLHDL 4 08/05/2020   Lab Results  Component Value Date   HGBA1C 5.9 05/02/2018      Assessment & Plan:   Problem List Items Addressed This Visit       Digestive   GERD (gastroesophageal reflux disease)     Other   Gout - Primary   Relevant Orders   Basic metabolic panel   CBC   Uric acid   Urinalysis, Routine w reflex microscopic   Healthcare maintenance   Relevant Orders   Lipid panel   Medication side effect   Relevant Orders   Vitamin B12   TSH   Left hip pain   Relevant Orders   DG Hip Unilat W OR W/O Pelvis Min 4 Views Left    No orders of the defined types were placed in this encounter.   Follow-up: Return in about 3 months (around 03/10/2022), or Hold Allopurinol and return fasting for blood work next week.Libby Maw, MD

## 2021-12-14 ENCOUNTER — Other Ambulatory Visit: Payer: Self-pay

## 2021-12-14 ENCOUNTER — Other Ambulatory Visit (INDEPENDENT_AMBULATORY_CARE_PROVIDER_SITE_OTHER): Payer: PPO

## 2021-12-14 DIAGNOSIS — M109 Gout, unspecified: Secondary | ICD-10-CM | POA: Diagnosis not present

## 2021-12-14 DIAGNOSIS — T887XXA Unspecified adverse effect of drug or medicament, initial encounter: Secondary | ICD-10-CM | POA: Diagnosis not present

## 2021-12-14 DIAGNOSIS — Z0000 Encounter for general adult medical examination without abnormal findings: Secondary | ICD-10-CM

## 2021-12-14 LAB — CBC
HCT: 45 % (ref 39.0–52.0)
Hemoglobin: 15.3 g/dL (ref 13.0–17.0)
MCHC: 34 g/dL (ref 30.0–36.0)
MCV: 89.7 fl (ref 78.0–100.0)
Platelets: 228 10*3/uL (ref 150.0–400.0)
RBC: 5.01 Mil/uL (ref 4.22–5.81)
RDW: 13.2 % (ref 11.5–15.5)
WBC: 5.5 10*3/uL (ref 4.0–10.5)

## 2021-12-14 LAB — URINALYSIS, ROUTINE W REFLEX MICROSCOPIC
Bilirubin Urine: NEGATIVE
Hgb urine dipstick: NEGATIVE
Ketones, ur: NEGATIVE
Leukocytes,Ua: NEGATIVE
Nitrite: NEGATIVE
RBC / HPF: NONE SEEN (ref 0–?)
Specific Gravity, Urine: 1.015 (ref 1.000–1.030)
Total Protein, Urine: NEGATIVE
Urine Glucose: NEGATIVE
Urobilinogen, UA: 0.2 (ref 0.0–1.0)
WBC, UA: NONE SEEN (ref 0–?)
pH: 6 (ref 5.0–8.0)

## 2021-12-14 LAB — VITAMIN B12: Vitamin B-12: 300 pg/mL (ref 211–911)

## 2021-12-14 LAB — URIC ACID: Uric Acid, Serum: 5.6 mg/dL (ref 4.0–7.8)

## 2021-12-14 LAB — LIPID PANEL
Cholesterol: 151 mg/dL (ref 0–200)
HDL: 43.7 mg/dL (ref >39.00)
LDL Cholesterol: 92 mg/dL (ref 0–99)
NonHDL: 106.8
Total CHOL/HDL Ratio: 3
Triglycerides: 76 mg/dL (ref 0.0–149.0)
VLDL: 15.2 mg/dL (ref 0.0–40.0)

## 2021-12-14 LAB — BASIC METABOLIC PANEL
BUN: 16 mg/dL (ref 6–23)
CO2: 31 mEq/L (ref 19–32)
Calcium: 9.2 mg/dL (ref 8.4–10.5)
Chloride: 104 mEq/L (ref 96–112)
Creatinine, Ser: 0.98 mg/dL (ref 0.40–1.50)
GFR: 74.26 mL/min (ref >60.00)
Glucose, Bld: 96 mg/dL (ref 70–99)
Potassium: 4.2 mEq/L (ref 3.5–5.1)
Sodium: 142 mEq/L (ref 135–145)

## 2021-12-14 LAB — TSH: TSH: 3.12 u[IU]/mL (ref 0.35–5.50)

## 2021-12-14 NOTE — Progress Notes (Signed)
Per the orders of Dr. Ethelene Hal pt is here for labs pt is here for labs, pt tolerated draw well.

## 2021-12-27 DIAGNOSIS — M9904 Segmental and somatic dysfunction of sacral region: Secondary | ICD-10-CM | POA: Diagnosis not present

## 2021-12-27 DIAGNOSIS — M62830 Muscle spasm of back: Secondary | ICD-10-CM | POA: Diagnosis not present

## 2021-12-27 DIAGNOSIS — M5441 Lumbago with sciatica, right side: Secondary | ICD-10-CM | POA: Diagnosis not present

## 2021-12-27 DIAGNOSIS — M9903 Segmental and somatic dysfunction of lumbar region: Secondary | ICD-10-CM | POA: Diagnosis not present

## 2022-01-05 DIAGNOSIS — M5441 Lumbago with sciatica, right side: Secondary | ICD-10-CM | POA: Diagnosis not present

## 2022-01-05 DIAGNOSIS — M9903 Segmental and somatic dysfunction of lumbar region: Secondary | ICD-10-CM | POA: Diagnosis not present

## 2022-01-05 DIAGNOSIS — M62830 Muscle spasm of back: Secondary | ICD-10-CM | POA: Diagnosis not present

## 2022-01-05 DIAGNOSIS — M9904 Segmental and somatic dysfunction of sacral region: Secondary | ICD-10-CM | POA: Diagnosis not present

## 2022-01-06 DIAGNOSIS — M5441 Lumbago with sciatica, right side: Secondary | ICD-10-CM | POA: Diagnosis not present

## 2022-01-06 DIAGNOSIS — M62830 Muscle spasm of back: Secondary | ICD-10-CM | POA: Diagnosis not present

## 2022-01-06 DIAGNOSIS — M9904 Segmental and somatic dysfunction of sacral region: Secondary | ICD-10-CM | POA: Diagnosis not present

## 2022-01-06 DIAGNOSIS — M9903 Segmental and somatic dysfunction of lumbar region: Secondary | ICD-10-CM | POA: Diagnosis not present

## 2022-01-13 ENCOUNTER — Other Ambulatory Visit: Payer: Self-pay | Admitting: Family Medicine

## 2022-01-13 MED ORDER — MELOXICAM 15 MG PO TABS: 15.0000 mg | ORAL_TABLET | Freq: Every day | ORAL | 0 refills | Status: DC | PRN

## 2022-01-13 NOTE — Telephone Encounter (Signed)
Caller Name: Kimari Coudriet ?Call back phone #: [phone] ? ?MEDICATION(S): meloxicam (MOBIC) 15 MG tablet [[id number]] ? ? ?Days of Med Remaining: just a few days ? ?Has the patient contacted their pharmacy (YES/NO)?  yes ?IF YES, when and what did the pharmacy advise? He needs to call his PCP.  ?IF NO, request that the patient contact the pharmacy for the refills in the future.  ?           The pharmacy will send an electronic request (except for controlled medications). ? ?Preferred Pharmacy: [facility] [address], [place] - [address]  ?[address] [place], [place] [address]  ?Phone:  [phone]  Fax:  [phone]  ? ?~~~Please advise patient/caregiver to allow 2-3 business days to process RX refills. ? ?

## 2022-01-13 NOTE — Telephone Encounter (Signed)
Refill request for pending Rx last OV 12/10/21 last refill 09/08/2021. Please advise.  ?

## 2022-03-07 ENCOUNTER — Other Ambulatory Visit: Payer: Self-pay | Admitting: Family Medicine

## 2022-03-07 DIAGNOSIS — K219 Gastro-esophageal reflux disease without esophagitis: Secondary | ICD-10-CM

## 2022-03-07 DIAGNOSIS — K2900 Acute gastritis without bleeding: Secondary | ICD-10-CM

## 2022-03-11 ENCOUNTER — Encounter: Payer: Self-pay | Admitting: Family Medicine

## 2022-03-11 ENCOUNTER — Ambulatory Visit (INDEPENDENT_AMBULATORY_CARE_PROVIDER_SITE_OTHER): Payer: PPO | Admitting: Family Medicine

## 2022-03-11 ENCOUNTER — Ambulatory Visit (INDEPENDENT_AMBULATORY_CARE_PROVIDER_SITE_OTHER): Payer: PPO

## 2022-03-11 VITALS — BP 136/72 | HR 47 | Temp 97.4°F | Ht 65.0 in | Wt 180.4 lb

## 2022-03-11 DIAGNOSIS — Z8739 Personal history of other diseases of the musculoskeletal system and connective tissue: Secondary | ICD-10-CM | POA: Insufficient documentation

## 2022-03-11 DIAGNOSIS — Z0000 Encounter for general adult medical examination without abnormal findings: Secondary | ICD-10-CM | POA: Diagnosis not present

## 2022-03-11 DIAGNOSIS — M25562 Pain in left knee: Secondary | ICD-10-CM

## 2022-03-11 DIAGNOSIS — M25552 Pain in left hip: Secondary | ICD-10-CM | POA: Diagnosis not present

## 2022-03-11 DIAGNOSIS — Z9189 Other specified personal risk factors, not elsewhere classified: Secondary | ICD-10-CM

## 2022-03-11 DIAGNOSIS — M130 Polyarthritis, unspecified: Secondary | ICD-10-CM | POA: Diagnosis not present

## 2022-03-11 DIAGNOSIS — E538 Deficiency of other specified B group vitamins: Secondary | ICD-10-CM | POA: Diagnosis not present

## 2022-03-11 LAB — COMPREHENSIVE METABOLIC PANEL
ALT: 14 U/L (ref 0–53)
AST: 17 U/L (ref 0–37)
Albumin: 4.2 g/dL (ref 3.5–5.2)
Alkaline Phosphatase: 66 U/L (ref 39–117)
BUN: 17 mg/dL (ref 6–23)
CO2: 30 mEq/L (ref 19–32)
Calcium: 9.4 mg/dL (ref 8.4–10.5)
Chloride: 105 mEq/L (ref 96–112)
Creatinine, Ser: 0.92 mg/dL (ref 0.40–1.50)
GFR: 79.98 mL/min (ref >60.00)
Glucose, Bld: 94 mg/dL (ref 70–99)
Potassium: 4.8 mEq/L (ref 3.5–5.1)
Sodium: 142 mEq/L (ref 135–145)
Total Bilirubin: 1 mg/dL (ref 0.2–1.2)
Total Protein: 6.5 g/dL (ref 6.0–8.3)

## 2022-03-11 LAB — PSA: PSA: 0.56 ng/mL (ref 0.10–4.00)

## 2022-03-11 LAB — URIC ACID: Uric Acid, Serum: 7.3 mg/dL (ref 4.0–7.8)

## 2022-03-11 LAB — VITAMIN B12: Vitamin B-12: 785 pg/mL (ref 211–911)

## 2022-03-11 MED ORDER — ATORVASTATIN CALCIUM 10 MG PO TABS: 10.0000 mg | ORAL_TABLET | Freq: Every day | ORAL | 3 refills | Status: DC

## 2022-03-11 MED ORDER — MELOXICAM 15 MG PO TABS: 15.0000 mg | ORAL_TABLET | Freq: Every day | ORAL | 0 refills | Status: DC | PRN

## 2022-03-11 NOTE — Progress Notes (Signed)
? ?Established Patient Office Visit ? ?Subjective   ?Patient ID: Gary Case, male    DOB: [birth year]  Age: 76 y.o. MRN: [id number] ? ?Chief Complaint  ?Patient presents with  ? Follow-up  ?  3 month follow up, concerns about left knee clicking when walking. Patient fasting.   ? ? ?HPI no gouty attacks since discontinuation of allopurinol.  Has discovered that he needs the meloxicam daily.  He has tried to cut back on it and arthralgias have returned.  Ongoing arthralgias in his back hips knees.  He has an elevated ASCVD risk score.  He has been taking B12 supplementation. ? ? ? ?Review of Systems  ?Constitutional: Negative.   ?HENT: Negative.    ?Eyes:  Negative for blurred vision, discharge and redness.  ?Respiratory: Negative.    ?Cardiovascular: Negative.   ?Gastrointestinal:  Negative for abdominal pain.  ?Genitourinary: Negative.   ?Musculoskeletal:  Positive for joint pain. Negative for myalgias.  ?Skin:  Negative for rash.  ?Neurological:  Negative for tingling, loss of consciousness and weakness.  ?Endo/Heme/Allergies:  Negative for polydipsia.  ? ?  ?Objective:  ?  ? ?BP 136/72 (BP Location: Right Arm, Patient Position: Sitting, Cuff Size: Normal)   Pulse (!) 47   Temp (!) 97.4 ?F (36.3 ?C) (Temporal)   Ht 5\' 5"  (1.651 m)   Wt 180 lb 6.4 oz (81.8 kg)   SpO2 98%   BMI 30.02 kg/m?  ? ? ?Physical Exam ?Constitutional:   ?   General: He is not in acute distress. ?   Appearance: Normal appearance. He is not ill-appearing, toxic-appearing or diaphoretic.  ?HENT:  ?   Head: Normocephalic and atraumatic.  ?   Right Ear: External ear normal.  ?   Left Ear: External ear normal.  ?   Mouth/Throat:  ?   Mouth: Mucous membranes are moist.  ?   Pharynx: Oropharynx is clear. No oropharyngeal exudate or posterior oropharyngeal erythema.  ?Eyes:  ?   General: No scleral icterus.    ?   Right eye: No discharge.     ?   Left eye: No discharge.  ?   Extraocular Movements: Extraocular movements intact.  ?    Conjunctiva/sclera: Conjunctivae normal.  ?   Pupils: Pupils are equal, round, and reactive to light.  ?Cardiovascular:  ?   Rate and Rhythm: Normal rate and regular rhythm.  ?Pulmonary:  ?   Effort: Pulmonary effort is normal. No respiratory distress.  ?   Breath sounds: Normal breath sounds.  ?Abdominal:  ?   General: Bowel sounds are normal.  ?Musculoskeletal:  ?   Cervical back: No rigidity or tenderness.  ?   Left hip: No bony tenderness. Decreased range of motion.  ?   Left knee: No swelling, effusion or erythema. Normal range of motion. No tenderness.  ?   Comments: Negative apprehension and negative grind test of the left knee.  ?Skin: ?   General: Skin is warm and dry.  ?Neurological:  ?   Mental Status: He is alert and oriented to person, place, and time.  ?Psychiatric:     ?   Mood and Affect: Mood normal.     ?   Behavior: Behavior normal.  ? ? ? ?No results found for any visits on 03/11/22. ? ? ? ?The 10-year ASCVD risk score (Arnett DK, et al., 2019) is: 29.5% ? ? The 10-year ASCVD risk score (Arnett DK, et al., 2019) is: 29.5% ?  Values used to calculate  the score: ?    Age: 53 years ?    Sex: Male ?    Is Non-Hispanic African American: No ?    Diabetic: No ?    Tobacco smoker: No ?    Systolic Blood Pressure: 136 mmHg ?    Is BP treated: No ?    HDL Cholesterol: 43.7 mg/dL ?    Total Cholesterol: 151 mg/dL  ?Assessment & Plan:  ? ?Problem List Items Addressed This Visit   ? ?  ? Other  ? Healthcare maintenance  ? Relevant Orders  ? PSA  ? Comprehensive metabolic panel  ? Left hip pain  ? Relevant Orders  ? DG Hip Unilat W OR W/O Pelvis 2-3 Views Left  ? Left knee pain  ? Relevant Orders  ? DG Knee Complete 4 Views Left  ? B12 deficiency  ? Relevant Orders  ? Vitamin B12  ? History of gout - Primary  ? Relevant Orders  ? Uric acid  ? At risk for cardiovascular event  ? Relevant Medications  ? atorvastatin (LIPITOR) 10 MG tablet  ? ?Other Visit Diagnoses   ? ? Arthritis of multiple sites      ?  Relevant Medications  ? meloxicam (MOBIC) 15 MG tablet  ? ?  ? ? ?Return in about 3 months (around 06/10/2022).  ?Agrees to start low-dose atorvastatin because of elevated ASCVD risk.  He will let me know if he develops any unusual muscle aches or pains.  Rechecking uric acid after discontinuation of allopurinol.  X-ray of her left hip and knee pain.  Discussed chronic use of meloxicam.  Apparently he needs it.  We will continue to monitor liver and kidney function.  Again advised the Shingrix vaccine and he said that he would think about it ? ?[name], MD ? ?

## 2022-03-16 DIAGNOSIS — G35 Multiple sclerosis: Secondary | ICD-10-CM | POA: Diagnosis not present

## 2022-03-16 DIAGNOSIS — M1612 Unilateral primary osteoarthritis, left hip: Secondary | ICD-10-CM | POA: Diagnosis not present

## 2022-03-16 DIAGNOSIS — M25562 Pain in left knee: Secondary | ICD-10-CM | POA: Diagnosis not present

## 2022-03-16 DIAGNOSIS — M47816 Spondylosis without myelopathy or radiculopathy, lumbar region: Secondary | ICD-10-CM | POA: Diagnosis not present

## 2022-03-25 DIAGNOSIS — M5442 Lumbago with sciatica, left side: Secondary | ICD-10-CM | POA: Diagnosis not present

## 2022-03-25 DIAGNOSIS — M25562 Pain in left knee: Secondary | ICD-10-CM | POA: Diagnosis not present

## 2022-03-25 DIAGNOSIS — M9904 Segmental and somatic dysfunction of sacral region: Secondary | ICD-10-CM | POA: Diagnosis not present

## 2022-03-25 DIAGNOSIS — M62830 Muscle spasm of back: Secondary | ICD-10-CM | POA: Diagnosis not present

## 2022-03-25 DIAGNOSIS — M9903 Segmental and somatic dysfunction of lumbar region: Secondary | ICD-10-CM | POA: Diagnosis not present

## 2022-03-28 ENCOUNTER — Encounter: Payer: Self-pay | Admitting: Family Medicine

## 2022-03-28 ENCOUNTER — Telehealth (INDEPENDENT_AMBULATORY_CARE_PROVIDER_SITE_OTHER): Payer: PPO | Admitting: Family Medicine

## 2022-03-28 VITALS — Ht 65.0 in

## 2022-03-28 DIAGNOSIS — R936 Abnormal findings on diagnostic imaging of limbs: Secondary | ICD-10-CM

## 2022-03-28 DIAGNOSIS — R937 Abnormal findings on diagnostic imaging of other parts of musculoskeletal system: Secondary | ICD-10-CM | POA: Diagnosis not present

## 2022-03-28 DIAGNOSIS — M25562 Pain in left knee: Secondary | ICD-10-CM | POA: Diagnosis not present

## 2022-03-28 DIAGNOSIS — M1710 Unilateral primary osteoarthritis, unspecified knee: Secondary | ICD-10-CM

## 2022-03-28 DIAGNOSIS — M25552 Pain in left hip: Secondary | ICD-10-CM

## 2022-03-28 NOTE — Progress Notes (Signed)
? ?Established Patient Office Visit ? ?Subjective   ?Patient ID: Gary Case, male    DOB: [birth year]  Age: 76 y.o. MRN: [id number] ? ?Chief Complaint  ?Patient presents with  ? Advice Only  ?  Discuss xray   ? ? ?HPI follow-up of x-ray results from those taken on prior visit.  X-ray of left hip showed preserved joint space.  Also demonstrated more multiple sclerotic lesions.  X-ray of left knee shows significant medial joint space deterioration.  Patient feels well.  Denies night sweats.  Weight has been stable.  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 ? ? ? ?Review of Systems  ?Constitutional: Negative.  Negative for diaphoresis, malaise/fatigue and weight loss.  ?Musculoskeletal:  Positive for back pain and joint pain.  ?Neurological: Negative.   ? ?  ?Objective:  ?  ? ?Ht 5\' 5"  (1.651 m)   BMI 30.02 kg/m?  ?Wt Readings from Last 3 Encounters:  ?03/11/22 180 lb 6.4 oz (81.8 kg)  ?12/10/21 179 lb 9.6 oz (81.5 kg)  ?05/25/21 173 lb (78.5 kg)  ? ?  ? ?Physical Exam ?Pulmonary:  ?   Effort: Pulmonary effort is normal.  ?Neurological:  ?   Mental Status: He is alert and oriented to person, place, and time.  ?Psychiatric:     ?   Mood and Affect: Mood normal.     ?   Behavior: Behavior normal.  ? ? ? ?No results found for any visits on 03/28/22. ? ? ? ?The 10-year ASCVD risk score (Arnett DK, et al., 2019) is: 31.3% ? ?  ?Assessment & Plan:  ? ?Problem List Items Addressed This Visit   ? ?  ? Musculoskeletal and Integument  ? Arthritis of knee  ?  ? Other  ? Left hip pain - Primary  ? Relevant Orders  ? Ambulatory referral to Orthopedic Surgery  ? Left knee pain  ? Relevant Orders  ? Ambulatory referral to Orthopedic Surgery  ? Abnormal x-ray of knee  ? Relevant Orders  ? NM Bone Scan Whole Body  ? Abnormal x-ray of pelvis  ? Relevant Orders  ? NM Bone Scan Whole Body  ? ? ?Return if symptoms worsen or fail to improve.  ? ?X-ray of pelvis shows scattered small sclerotic densities.   Bone scan of body is recommended.  This has been ordered.  X-ray of knee shows significant medial compartment collapse calcification in the popliteal fossa.  Orthopedic referral is in order ?2020, MD ? ?Virtual Visit via Telephone Note ? ?I connected with Gary Case on 03/28/22 at  3:40 PM EDT by telephone and verified that I am speaking with the correct person using two identifiers. ? ?Location: ?Patient: home  ?Provider: work ?  ?I discussed the limitations, risks, security and privacy concerns of performing an evaluation and management service by telephone and the availability of in person appointments. I also discussed with the patient that there may be a patient responsible charge related to this service. The patient expressed understanding and agreed to proceed. ? ? ?History of Present Illness: ? ?  ?Observations/Objective: ? ? ?Assessment and Plan: ? ? ?Follow Up Instructions: ? ?  ?I discussed the assessment and treatment plan with the patient. The patient was provided an opportunity to ask questions and all were answered. The patient agreed with the plan and demonstrated an understanding of the instructions. ?  ?The patient was advised to call back or seek an in-person evaluation  if the symptoms worsen or if the condition fails to improve as anticipated. ? ?I provided 20 minutes of non-face-to-face time during this encounter. ? ? ?Gary Sax, MD  ? ?Interactive video and audio telecommunications were attempted between myself and the patient. However they failed due to the patient having technical difficulties or not having access to video capability. We continued and completed with audio only.  ? ?

## 2022-03-30 ENCOUNTER — Ambulatory Visit: Payer: PPO | Admitting: Orthopaedic Surgery

## 2022-03-30 ENCOUNTER — Telehealth: Payer: Self-pay | Admitting: Family Medicine

## 2022-03-30 ENCOUNTER — Encounter: Payer: Self-pay | Admitting: Orthopaedic Surgery

## 2022-03-30 DIAGNOSIS — G8929 Other chronic pain: Secondary | ICD-10-CM

## 2022-03-30 DIAGNOSIS — M25562 Pain in left knee: Secondary | ICD-10-CM | POA: Diagnosis not present

## 2022-03-30 DIAGNOSIS — M1712 Unilateral primary osteoarthritis, left knee: Secondary | ICD-10-CM | POA: Diagnosis not present

## 2022-03-30 NOTE — Telephone Encounter (Signed)
Call from [facility]. The referral was sent to them but should have been sent to Orthopedic office. It's for left hip pain. ?

## 2022-03-30 NOTE — Telephone Encounter (Signed)
Referral message show it was sent to [facility] surgery by mistake but also sent to the correct place and patient should be called to schedule per messages on 03/29/22 ?

## 2022-03-30 NOTE — Progress Notes (Signed)
The patient is a very pleasant and active 76 year old gentleman sent from Dr. Doreene Burke to evaluate and treat mainly left knee pain.  He does have plain films of his pelvis and left hip as well as knees on the canopy system for me to review.  He does have a whole body bone scan pending due to calcification seen in his pelvis and I think this is reasonable.  He denied any significant hip pain and has had sciatica in the past.  He does do a lot of exercise walking he wants to make sure he is not damaging anything worse.  He gets a lot of clicking in his left knee. ? ?I did go over x-rays of his left knee and he does have tricompartment arthritis with mainly medial joint space narrowing that is bone-on-bone.  There is calcifications all around the lateral meniscus and significant patellofemoral narrowing with lateral tracking patella.  That is what is causing his clicking in terms of the patella tracking.  His hips showed normal-appearing joint space on both sides.  I can see the calcifications that the radiologist pointing out there is no lytic areas. ? ?His left hip moves smoothly and fluidly.  His right knee shows no effusion but there is varus malalignment that is correctable.  There is also patellofemoral crepitation and the patella tracks laterally consistent with an osteoarthritic knee. ? ?He states that he prefers conservative treatment options for his knee.  We talked about the aspect of trying a medication such as turmeric and later considering injections such as steroid or later even hyaluronic acid.  He can work on Fre[employer].  He can continue his exercise walks as well.  All questions and concerns were answered and addressed.  Follow-up is as needed.  If he does wish to pursue other options such as injections he can get in touch with [place] directly. ?

## 2022-06-01 ENCOUNTER — Other Ambulatory Visit: Payer: Self-pay | Admitting: Family Medicine

## 2022-06-01 DIAGNOSIS — M25562 Pain in left knee: Secondary | ICD-10-CM | POA: Diagnosis not present

## 2022-06-01 DIAGNOSIS — M9904 Segmental and somatic dysfunction of sacral region: Secondary | ICD-10-CM | POA: Diagnosis not present

## 2022-06-01 DIAGNOSIS — M130 Polyarthritis, unspecified: Secondary | ICD-10-CM

## 2022-06-01 DIAGNOSIS — M62830 Muscle spasm of back: Secondary | ICD-10-CM | POA: Diagnosis not present

## 2022-06-01 DIAGNOSIS — M5442 Lumbago with sciatica, left side: Secondary | ICD-10-CM | POA: Diagnosis not present

## 2022-06-01 DIAGNOSIS — M9903 Segmental and somatic dysfunction of lumbar region: Secondary | ICD-10-CM | POA: Diagnosis not present

## 2022-06-03 DIAGNOSIS — M9903 Segmental and somatic dysfunction of lumbar region: Secondary | ICD-10-CM | POA: Diagnosis not present

## 2022-06-03 DIAGNOSIS — M9904 Segmental and somatic dysfunction of sacral region: Secondary | ICD-10-CM | POA: Diagnosis not present

## 2022-06-03 DIAGNOSIS — M5442 Lumbago with sciatica, left side: Secondary | ICD-10-CM | POA: Diagnosis not present

## 2022-06-03 DIAGNOSIS — M62830 Muscle spasm of back: Secondary | ICD-10-CM | POA: Diagnosis not present

## 2022-06-03 DIAGNOSIS — M25562 Pain in left knee: Secondary | ICD-10-CM | POA: Diagnosis not present

## 2022-06-13 DIAGNOSIS — M25562 Pain in left knee: Secondary | ICD-10-CM | POA: Diagnosis not present

## 2022-06-13 DIAGNOSIS — M9904 Segmental and somatic dysfunction of sacral region: Secondary | ICD-10-CM | POA: Diagnosis not present

## 2022-06-13 DIAGNOSIS — M5442 Lumbago with sciatica, left side: Secondary | ICD-10-CM | POA: Diagnosis not present

## 2022-06-13 DIAGNOSIS — M9903 Segmental and somatic dysfunction of lumbar region: Secondary | ICD-10-CM | POA: Diagnosis not present

## 2022-06-13 DIAGNOSIS — M62830 Muscle spasm of back: Secondary | ICD-10-CM | POA: Diagnosis not present

## 2022-06-17 DIAGNOSIS — M25562 Pain in left knee: Secondary | ICD-10-CM | POA: Diagnosis not present

## 2022-06-17 DIAGNOSIS — M62830 Muscle spasm of back: Secondary | ICD-10-CM | POA: Diagnosis not present

## 2022-06-17 DIAGNOSIS — M9904 Segmental and somatic dysfunction of sacral region: Secondary | ICD-10-CM | POA: Diagnosis not present

## 2022-06-17 DIAGNOSIS — M9903 Segmental and somatic dysfunction of lumbar region: Secondary | ICD-10-CM | POA: Diagnosis not present

## 2022-06-17 DIAGNOSIS — M5442 Lumbago with sciatica, left side: Secondary | ICD-10-CM | POA: Diagnosis not present

## 2022-06-21 ENCOUNTER — Ambulatory Visit (INDEPENDENT_AMBULATORY_CARE_PROVIDER_SITE_OTHER): Payer: PPO | Admitting: Family Medicine

## 2022-06-21 ENCOUNTER — Encounter: Payer: Self-pay | Admitting: Family Medicine

## 2022-06-21 VITALS — BP 144/78 | HR 51 | Temp 97.3°F | Ht 65.0 in | Wt 178.0 lb

## 2022-06-21 DIAGNOSIS — M1710 Unilateral primary osteoarthritis, unspecified knee: Secondary | ICD-10-CM

## 2022-06-21 DIAGNOSIS — K219 Gastro-esophageal reflux disease without esophagitis: Secondary | ICD-10-CM | POA: Diagnosis not present

## 2022-06-21 DIAGNOSIS — Z9189 Other specified personal risk factors, not elsewhere classified: Secondary | ICD-10-CM

## 2022-06-21 DIAGNOSIS — M130 Polyarthritis, unspecified: Secondary | ICD-10-CM | POA: Diagnosis not present

## 2022-06-21 DIAGNOSIS — Z8739 Personal history of other diseases of the musculoskeletal system and connective tissue: Secondary | ICD-10-CM | POA: Diagnosis not present

## 2022-06-21 DIAGNOSIS — K2900 Acute gastritis without bleeding: Secondary | ICD-10-CM

## 2022-06-21 LAB — LIPID PANEL
Cholesterol: 130 mg/dL (ref 0–200)
HDL: 46.7 mg/dL (ref >39.00)
LDL Cholesterol: 71 mg/dL (ref 0–99)
NonHDL: 83.04
Total CHOL/HDL Ratio: 3
Triglycerides: 58 mg/dL (ref 0.0–149.0)
VLDL: 11.6 mg/dL (ref 0.0–40.0)

## 2022-06-21 MED ORDER — ATORVASTATIN CALCIUM 10 MG PO TABS: 10.0000 mg | ORAL_TABLET | Freq: Every day | ORAL | 3 refills | Status: DC

## 2022-06-21 MED ORDER — MELOXICAM 15 MG PO TABS
15.0000 mg | ORAL_TABLET | Freq: Every day | ORAL | 0 refills | Status: DC | PRN
Start: 2022-06-21 — End: 2022-09-30

## 2022-06-21 MED ORDER — PANTOPRAZOLE SODIUM 40 MG PO TBEC: 40.0000 mg | DELAYED_RELEASE_TABLET | Freq: Every day | ORAL | 3 refills | Status: DC

## 2022-06-21 NOTE — Progress Notes (Signed)
Established Patient Office Visit  Subjective   Patient ID: Gary Case, male    DOB: January 11, 1946  Age: 76 y.o. MRN: [id number]  Chief Complaint  Patient presents with   Follow-up    Follow up, not sure if Tylenol is okay to take while taking Meloxicam.     HPI follow-up of elevated cholesterol with increased ASCVD risk score, GERD, osteoarthritis.  Continues low-dose atorvastatin without issue.  Continues Protonix for relief of GERD.  Taking meloxicam for multiple arthritic pains.  Gary Case would like to have the option to take Tylenol as needed.  Gary Case has had no gouty attacks since discontinuing allopurinol.  Remains quite active by walking and working in his yard.  Status post orthopedic consultation.    Review of Systems  Constitutional: Negative.   HENT: Negative.    Eyes:  Negative for blurred vision, discharge and redness.  Respiratory: Negative.    Cardiovascular: Negative.   Gastrointestinal:  Negative for abdominal pain.  Genitourinary: Negative.   Musculoskeletal:  Positive for joint pain. Negative for myalgias.  Skin:  Negative for rash.  Neurological:  Negative for tingling, loss of consciousness and weakness.  Endo/Heme/Allergies:  Negative for polydipsia.      Objective:     BP (!) 144/78 (BP Location: Right Arm, Patient Position: Sitting, Cuff Size: Normal)   Pulse (!) 51   Temp (!) 97.3 F (36.3 C) (Temporal)   Ht 5\' 5"  (1.651 m)   Wt 178 lb (80.7 kg)   SpO2 98%   BMI 29.62 kg/m  BP Readings from Last 3 Encounters:  06/21/22 (!) 144/78  03/11/22 136/72  12/10/21 118/68   Wt Readings from Last 3 Encounters:  06/21/22 178 lb (80.7 kg)  03/11/22 180 lb 6.4 oz (81.8 kg)  12/10/21 179 lb 9.6 oz (81.5 kg)      Physical Exam Constitutional:      General: Gary Case is not in acute distress.    Appearance: Normal appearance. Gary Case is not ill-appearing, toxic-appearing or diaphoretic.  HENT:     Head: Normocephalic and atraumatic.     Right Ear: External ear  normal.     Left Ear: External ear normal.     Mouth/Throat:     Mouth: Mucous membranes are moist.     Pharynx: Oropharynx is clear. No oropharyngeal exudate or posterior oropharyngeal erythema.  Eyes:     General: No scleral icterus.       Right eye: No discharge.        Left eye: No discharge.     Extraocular Movements: Extraocular movements intact.     Conjunctiva/sclera: Conjunctivae normal.     Pupils: Pupils are equal, round, and reactive to light.  Cardiovascular:     Rate and Rhythm: Normal rate and regular rhythm.  Pulmonary:     Effort: Pulmonary effort is normal. No respiratory distress.     Breath sounds: Normal breath sounds.  Abdominal:     Tenderness: There is no guarding.  Musculoskeletal:     Cervical back: No rigidity or tenderness.     Left knee: Swelling present.  Skin:    General: Skin is warm and dry.  Neurological:     Mental Status: Gary Case is alert and oriented to person, place, and time.  Psychiatric:        Mood and Affect: Mood normal.        Behavior: Behavior normal.      No results found for any visits on 06/21/22.    The  10-year ASCVD risk score (Arnett DK, et al., 2019) is: 34%    Assessment & Plan:   Problem List Items Addressed This Visit       Digestive   Gastroesophageal reflux disease   Relevant Medications   pantoprazole (PROTONIX) 40 MG tablet   Acute gastritis   Relevant Medications   pantoprazole (PROTONIX) 40 MG tablet     Musculoskeletal and Integument   Arthritis of knee   Relevant Medications   acetaminophen (TYLENOL) 650 MG CR tablet   meloxicam (MOBIC) 15 MG tablet   Arthritis of multiple sites   Relevant Medications   acetaminophen (TYLENOL) 650 MG CR tablet   meloxicam (MOBIC) 15 MG tablet     Other   History of gout - Primary   At risk for cardiovascular event   Relevant Medications   atorvastatin (LIPITOR) 10 MG tablet   Other Relevant Orders   Lipid panel    Return in about 6 months (around  12/22/2022), or if symptoms worsen or fail to improve.  Return fasting for above ordered blood work.  May take Tylenol 650 mg 1 twice daily as needed.  Continue with meloxicam.  [name], MD

## 2022-06-22 DIAGNOSIS — M9904 Segmental and somatic dysfunction of sacral region: Secondary | ICD-10-CM | POA: Diagnosis not present

## 2022-06-22 DIAGNOSIS — M25562 Pain in left knee: Secondary | ICD-10-CM | POA: Diagnosis not present

## 2022-06-22 DIAGNOSIS — M5442 Lumbago with sciatica, left side: Secondary | ICD-10-CM | POA: Diagnosis not present

## 2022-06-22 DIAGNOSIS — M9903 Segmental and somatic dysfunction of lumbar region: Secondary | ICD-10-CM | POA: Diagnosis not present

## 2022-06-22 DIAGNOSIS — M62830 Muscle spasm of back: Secondary | ICD-10-CM | POA: Diagnosis not present

## 2022-06-27 DIAGNOSIS — M25562 Pain in left knee: Secondary | ICD-10-CM | POA: Diagnosis not present

## 2022-06-27 DIAGNOSIS — M9903 Segmental and somatic dysfunction of lumbar region: Secondary | ICD-10-CM | POA: Diagnosis not present

## 2022-06-27 DIAGNOSIS — M9904 Segmental and somatic dysfunction of sacral region: Secondary | ICD-10-CM | POA: Diagnosis not present

## 2022-06-27 DIAGNOSIS — M5442 Lumbago with sciatica, left side: Secondary | ICD-10-CM | POA: Diagnosis not present

## 2022-06-27 DIAGNOSIS — M62830 Muscle spasm of back: Secondary | ICD-10-CM | POA: Diagnosis not present

## 2022-07-04 DIAGNOSIS — M9903 Segmental and somatic dysfunction of lumbar region: Secondary | ICD-10-CM | POA: Diagnosis not present

## 2022-07-04 DIAGNOSIS — M9904 Segmental and somatic dysfunction of sacral region: Secondary | ICD-10-CM | POA: Diagnosis not present

## 2022-07-04 DIAGNOSIS — M62830 Muscle spasm of back: Secondary | ICD-10-CM | POA: Diagnosis not present

## 2022-07-04 DIAGNOSIS — M5442 Lumbago with sciatica, left side: Secondary | ICD-10-CM | POA: Diagnosis not present

## 2022-07-04 DIAGNOSIS — M25562 Pain in left knee: Secondary | ICD-10-CM | POA: Diagnosis not present

## 2022-07-11 DIAGNOSIS — M25562 Pain in left knee: Secondary | ICD-10-CM | POA: Diagnosis not present

## 2022-07-11 DIAGNOSIS — M62830 Muscle spasm of back: Secondary | ICD-10-CM | POA: Diagnosis not present

## 2022-07-11 DIAGNOSIS — M9904 Segmental and somatic dysfunction of sacral region: Secondary | ICD-10-CM | POA: Diagnosis not present

## 2022-07-11 DIAGNOSIS — M5442 Lumbago with sciatica, left side: Secondary | ICD-10-CM | POA: Diagnosis not present

## 2022-07-11 DIAGNOSIS — M9903 Segmental and somatic dysfunction of lumbar region: Secondary | ICD-10-CM | POA: Diagnosis not present

## 2022-07-20 DIAGNOSIS — M9903 Segmental and somatic dysfunction of lumbar region: Secondary | ICD-10-CM | POA: Diagnosis not present

## 2022-07-20 DIAGNOSIS — M62830 Muscle spasm of back: Secondary | ICD-10-CM | POA: Diagnosis not present

## 2022-07-20 DIAGNOSIS — M9904 Segmental and somatic dysfunction of sacral region: Secondary | ICD-10-CM | POA: Diagnosis not present

## 2022-07-20 DIAGNOSIS — M5442 Lumbago with sciatica, left side: Secondary | ICD-10-CM | POA: Diagnosis not present

## 2022-07-20 DIAGNOSIS — M25562 Pain in left knee: Secondary | ICD-10-CM | POA: Diagnosis not present

## 2022-07-25 DIAGNOSIS — M9903 Segmental and somatic dysfunction of lumbar region: Secondary | ICD-10-CM | POA: Diagnosis not present

## 2022-07-25 DIAGNOSIS — M9904 Segmental and somatic dysfunction of sacral region: Secondary | ICD-10-CM | POA: Diagnosis not present

## 2022-07-25 DIAGNOSIS — M25562 Pain in left knee: Secondary | ICD-10-CM | POA: Diagnosis not present

## 2022-07-25 DIAGNOSIS — M5442 Lumbago with sciatica, left side: Secondary | ICD-10-CM | POA: Diagnosis not present

## 2022-07-25 DIAGNOSIS — M62830 Muscle spasm of back: Secondary | ICD-10-CM | POA: Diagnosis not present

## 2022-08-01 ENCOUNTER — Ambulatory Visit: Payer: PPO | Admitting: Family Medicine

## 2022-08-03 DIAGNOSIS — M5442 Lumbago with sciatica, left side: Secondary | ICD-10-CM | POA: Diagnosis not present

## 2022-08-03 DIAGNOSIS — M62830 Muscle spasm of back: Secondary | ICD-10-CM | POA: Diagnosis not present

## 2022-08-03 DIAGNOSIS — M9904 Segmental and somatic dysfunction of sacral region: Secondary | ICD-10-CM | POA: Diagnosis not present

## 2022-08-03 DIAGNOSIS — M25562 Pain in left knee: Secondary | ICD-10-CM | POA: Diagnosis not present

## 2022-08-03 DIAGNOSIS — M9903 Segmental and somatic dysfunction of lumbar region: Secondary | ICD-10-CM | POA: Diagnosis not present

## 2022-08-05 ENCOUNTER — Ambulatory Visit (INDEPENDENT_AMBULATORY_CARE_PROVIDER_SITE_OTHER): Payer: PPO | Admitting: Family Medicine

## 2022-08-05 ENCOUNTER — Encounter: Payer: Self-pay | Admitting: Family Medicine

## 2022-08-05 VITALS — BP 124/68 | HR 46 | Temp 97.3°F | Ht 65.0 in | Wt 172.2 lb

## 2022-08-05 DIAGNOSIS — Z8739 Personal history of other diseases of the musculoskeletal system and connective tissue: Secondary | ICD-10-CM | POA: Diagnosis not present

## 2022-08-05 DIAGNOSIS — H8309 Labyrinthitis, unspecified ear: Secondary | ICD-10-CM | POA: Diagnosis not present

## 2022-08-05 DIAGNOSIS — K529 Noninfective gastroenteritis and colitis, unspecified: Secondary | ICD-10-CM | POA: Diagnosis not present

## 2022-08-05 MED ORDER — MECLIZINE HCL 25 MG PO TABS: 25.0000 mg | ORAL_TABLET | Freq: Three times a day (TID) | ORAL | 0 refills | Status: DC | PRN

## 2022-08-05 NOTE — Progress Notes (Signed)
Established Patient Office Visit  Subjective   Patient ID: Gary Case, male    DOB: 07-14-1946  Age: 76 y.o. MRN: [id number]  Chief Complaint  Patient presents with   Nausea    Nausea, swirly head, little diarrhea patient not sure if this is from ant bites. Symptoms x 3 weeks come and go. Losing weight, poor appetite.     HPI reports an ongoing history of watery diarrhea that is slowly resolving over the last 2 to 3 weeks.  There was no blood or pus with the stool.  There was no fever or chills.  He has also been dizzy with a spinning sensation associated with nausea.  He denies headaches.  Unilateral weakness numbness or tingling.    Review of Systems  Constitutional: Negative.   HENT: Negative.    Eyes:  Negative for blurred vision, discharge and redness.  Respiratory: Negative.    Cardiovascular: Negative.   Gastrointestinal:  Positive for nausea. Negative for abdominal pain and vomiting.  Genitourinary: Negative.   Musculoskeletal: Negative.  Negative for myalgias.  Skin:  Negative for rash.  Neurological:  Positive for dizziness. Negative for tingling, loss of consciousness, weakness and headaches.  Endo/Heme/Allergies:  Negative for polydipsia.      Objective:     BP 124/68 (BP Location: Right Arm, Patient Position: Sitting, Cuff Size: Normal)   Pulse (!) 46   Temp (!) 97.3 F (36.3 C) (Temporal)   Ht 5\' 5"  (1.651 m)   Wt 172 lb 3.2 oz (78.1 kg)   SpO2 98%   BMI 28.66 kg/m  Wt Readings from Last 3 Encounters:  08/05/22 172 lb 3.2 oz (78.1 kg)  06/21/22 178 lb (80.7 kg)  03/11/22 180 lb 6.4 oz (81.8 kg)      Physical Exam Constitutional:      General: He is not in acute distress.    Appearance: Normal appearance. He is not ill-appearing, toxic-appearing or diaphoretic.  HENT:     Head: Normocephalic and atraumatic.     Right Ear: Tympanic membrane, ear canal and external ear normal.     Left Ear: Tympanic membrane, ear canal and external ear  normal.     Mouth/Throat:     Mouth: Mucous membranes are moist.     Pharynx: Oropharynx is clear. No oropharyngeal exudate or posterior oropharyngeal erythema.  Eyes:     General: No scleral icterus.       Right eye: No discharge.        Left eye: No discharge.     Extraocular Movements: Extraocular movements intact.     Conjunctiva/sclera: Conjunctivae normal.     Pupils: Pupils are equal, round, and reactive to light.  Cardiovascular:     Rate and Rhythm: Normal rate and regular rhythm.  Pulmonary:     Effort: Pulmonary effort is normal. No respiratory distress.     Breath sounds: Normal breath sounds.  Abdominal:     General: Bowel sounds are normal. There is no distension.     Tenderness: There is no abdominal tenderness. There is no guarding or rebound.  Musculoskeletal:     Cervical back: No rigidity or tenderness.  Skin:    General: Skin is warm and dry.  Neurological:     Mental Status: He is alert and oriented to person, place, and time.     Cranial Nerves: No cranial nerve deficit, dysarthria or facial asymmetry.  Psychiatric:        Mood and Affect: Mood normal.  Behavior: Behavior normal.      No results found for any visits on 08/05/22.    The 10-year ASCVD risk score (Arnett DK, et al., 2019) is: 26%    Assessment & Plan:   Problem List Items Addressed This Visit       Digestive   Gastroenteritis   Relevant Orders   CBC   Comprehensive metabolic panel   Urinalysis, Routine w reflex microscopic     Nervous and Auditory   Labyrinthitis - Primary   Relevant Medications   meclizine (ANTIVERT) 25 MG tablet     Other   History of gout   Relevant Orders   Comprehensive metabolic panel   Uric acid    Return in about 4 weeks (around 09/02/2022), or if symptoms worsen or fail to improve.    Libby Maw, MD

## 2022-08-06 LAB — CBC
Hematocrit: 41.1 % (ref 37.5–51.0)
Hemoglobin: 14.2 g/dL (ref 13.0–17.7)
MCH: 31.5 pg (ref 26.6–33.0)
MCHC: 34.5 g/dL (ref 31.5–35.7)
MCV: 91 fL (ref 79–97)
Platelets: 255 10*3/uL (ref 150–450)
RBC: 4.51 x10E6/uL (ref 4.14–5.80)
RDW: 12.1 % (ref 11.6–15.4)
WBC: 6.2 10*3/uL (ref 3.4–10.8)

## 2022-08-06 LAB — URINALYSIS, ROUTINE W REFLEX MICROSCOPIC
Bilirubin, UA: NEGATIVE
Glucose, UA: NEGATIVE
Ketones, UA: NEGATIVE
Leukocytes,UA: NEGATIVE
Nitrite, UA: NEGATIVE
Protein,UA: NEGATIVE
RBC, UA: NEGATIVE
Specific Gravity, UA: 1.021 (ref 1.005–1.030)
Urobilinogen, Ur: 0.2 mg/dL (ref 0.2–1.0)
pH, UA: 5.5 (ref 5.0–7.5)

## 2022-08-06 LAB — COMPREHENSIVE METABOLIC PANEL
ALT: 20 IU/L (ref 0–44)
AST: 17 IU/L (ref 0–40)
Albumin/Globulin Ratio: 1.8 (ref 1.2–2.2)
Albumin: 4.1 g/dL (ref 3.8–4.8)
Alkaline Phosphatase: 72 IU/L (ref 44–121)
BUN/Creatinine Ratio: 21 (ref 10–24)
BUN: 16 mg/dL (ref 8–27)
Bilirubin Total: 0.4 mg/dL (ref 0.0–1.2)
CO2: 25 mmol/L (ref 20–29)
Calcium: 9 mg/dL (ref 8.6–10.2)
Chloride: 107 mmol/L — ABNORMAL HIGH (ref 96–106)
Creatinine, Ser: 0.77 mg/dL (ref 0.76–1.27)
Globulin, Total: 2.3 g/dL (ref 1.5–4.5)
Glucose: 90 mg/dL (ref 70–99)
Potassium: 5 mmol/L (ref 3.5–5.2)
Sodium: 146 mmol/L — ABNORMAL HIGH (ref 134–144)
Total Protein: 6.4 g/dL (ref 6.0–8.5)
eGFR: 92 mL/min/{1.73_m2} (ref >59)

## 2022-08-06 LAB — URIC ACID: Uric Acid: 6.7 mg/dL (ref 3.8–8.4)

## 2022-08-15 DIAGNOSIS — M9903 Segmental and somatic dysfunction of lumbar region: Secondary | ICD-10-CM | POA: Diagnosis not present

## 2022-08-15 DIAGNOSIS — M5442 Lumbago with sciatica, left side: Secondary | ICD-10-CM | POA: Diagnosis not present

## 2022-08-15 DIAGNOSIS — M25562 Pain in left knee: Secondary | ICD-10-CM | POA: Diagnosis not present

## 2022-08-15 DIAGNOSIS — M62830 Muscle spasm of back: Secondary | ICD-10-CM | POA: Diagnosis not present

## 2022-08-15 DIAGNOSIS — M9904 Segmental and somatic dysfunction of sacral region: Secondary | ICD-10-CM | POA: Diagnosis not present

## 2022-08-22 ENCOUNTER — Other Ambulatory Visit: Payer: Self-pay | Admitting: Family Medicine

## 2022-08-22 DIAGNOSIS — H8309 Labyrinthitis, unspecified ear: Secondary | ICD-10-CM

## 2022-08-25 MED ORDER — MECLIZINE HCL 25 MG PO TABS: 25.0000 mg | ORAL_TABLET | Freq: Three times a day (TID) | ORAL | 0 refills | Status: AC | PRN

## 2022-08-29 DIAGNOSIS — M9903 Segmental and somatic dysfunction of lumbar region: Secondary | ICD-10-CM | POA: Diagnosis not present

## 2022-08-29 DIAGNOSIS — M62830 Muscle spasm of back: Secondary | ICD-10-CM | POA: Diagnosis not present

## 2022-08-29 DIAGNOSIS — M5442 Lumbago with sciatica, left side: Secondary | ICD-10-CM | POA: Diagnosis not present

## 2022-08-29 DIAGNOSIS — M25562 Pain in left knee: Secondary | ICD-10-CM | POA: Diagnosis not present

## 2022-08-29 DIAGNOSIS — M9904 Segmental and somatic dysfunction of sacral region: Secondary | ICD-10-CM | POA: Diagnosis not present

## 2022-09-05 DIAGNOSIS — M5442 Lumbago with sciatica, left side: Secondary | ICD-10-CM | POA: Diagnosis not present

## 2022-09-05 DIAGNOSIS — M9904 Segmental and somatic dysfunction of sacral region: Secondary | ICD-10-CM | POA: Diagnosis not present

## 2022-09-05 DIAGNOSIS — M62830 Muscle spasm of back: Secondary | ICD-10-CM | POA: Diagnosis not present

## 2022-09-05 DIAGNOSIS — M25562 Pain in left knee: Secondary | ICD-10-CM | POA: Diagnosis not present

## 2022-09-05 DIAGNOSIS — M9903 Segmental and somatic dysfunction of lumbar region: Secondary | ICD-10-CM | POA: Diagnosis not present

## 2022-09-12 DIAGNOSIS — M62830 Muscle spasm of back: Secondary | ICD-10-CM | POA: Diagnosis not present

## 2022-09-12 DIAGNOSIS — M9904 Segmental and somatic dysfunction of sacral region: Secondary | ICD-10-CM | POA: Diagnosis not present

## 2022-09-12 DIAGNOSIS — M5442 Lumbago with sciatica, left side: Secondary | ICD-10-CM | POA: Diagnosis not present

## 2022-09-12 DIAGNOSIS — M9903 Segmental and somatic dysfunction of lumbar region: Secondary | ICD-10-CM | POA: Diagnosis not present

## 2022-09-12 DIAGNOSIS — M25562 Pain in left knee: Secondary | ICD-10-CM | POA: Diagnosis not present

## 2022-09-26 DIAGNOSIS — M5442 Lumbago with sciatica, left side: Secondary | ICD-10-CM | POA: Diagnosis not present

## 2022-09-26 DIAGNOSIS — M9904 Segmental and somatic dysfunction of sacral region: Secondary | ICD-10-CM | POA: Diagnosis not present

## 2022-09-26 DIAGNOSIS — M9903 Segmental and somatic dysfunction of lumbar region: Secondary | ICD-10-CM | POA: Diagnosis not present

## 2022-09-26 DIAGNOSIS — M25562 Pain in left knee: Secondary | ICD-10-CM | POA: Diagnosis not present

## 2022-09-26 DIAGNOSIS — M62830 Muscle spasm of back: Secondary | ICD-10-CM | POA: Diagnosis not present

## 2022-09-30 ENCOUNTER — Other Ambulatory Visit: Payer: Self-pay | Admitting: Family Medicine

## 2022-09-30 DIAGNOSIS — M130 Polyarthritis, unspecified: Secondary | ICD-10-CM

## 2022-10-04 ENCOUNTER — Encounter: Payer: Self-pay | Admitting: Family Medicine

## 2022-10-17 DIAGNOSIS — M9903 Segmental and somatic dysfunction of lumbar region: Secondary | ICD-10-CM | POA: Diagnosis not present

## 2022-10-17 DIAGNOSIS — M62830 Muscle spasm of back: Secondary | ICD-10-CM | POA: Diagnosis not present

## 2022-10-17 DIAGNOSIS — M9904 Segmental and somatic dysfunction of sacral region: Secondary | ICD-10-CM | POA: Diagnosis not present

## 2022-10-17 DIAGNOSIS — M25562 Pain in left knee: Secondary | ICD-10-CM | POA: Diagnosis not present

## 2022-10-17 DIAGNOSIS — M5442 Lumbago with sciatica, left side: Secondary | ICD-10-CM | POA: Diagnosis not present

## 2022-10-20 ENCOUNTER — Telehealth (INDEPENDENT_AMBULATORY_CARE_PROVIDER_SITE_OTHER): Payer: PPO | Admitting: Family Medicine

## 2022-10-20 ENCOUNTER — Ambulatory Visit: Payer: PPO | Admitting: Family Medicine

## 2022-10-20 VITALS — Temp 101.0°F

## 2022-10-20 DIAGNOSIS — R509 Fever, unspecified: Secondary | ICD-10-CM | POA: Diagnosis not present

## 2022-10-20 DIAGNOSIS — R300 Dysuria: Secondary | ICD-10-CM

## 2022-10-20 NOTE — Progress Notes (Signed)
Virtual Visit via Video Note  I connected with Gary Case on 10/20/22 at 10:40 AM EST by a video enabled telemedicine application and verified that I am speaking with the correct person using two identifiers.  Location: Patient: Home Provider: Office   I discussed the limitations of evaluation and management by telemedicine and the availability of in person appointments. The patient expressed understanding and agreed to proceed.  History of Present Illness:  Dysuria.  Patient presents with 1 day of fevers, chills, myalgias associated with nocturia and dysuria.  Patient stated that the symptoms started last night.  Stated that he felt very cold.  Then he developed dysuria and nocturia and urinary 4 times a night.  Patient was febrile to 101 Fahrenheit this a.m.  He took Tylenol 650 mg with improvement of fever.  Patient took an at home COVID test this a.m.  It was negative.  Patient was thinking he might have the flu.  And was curious to get Tamiflu prescription.  Patient does not have a history of nocturia or dysuria.  He has never had a UTI or kidney infection.  He does not have any known ill contacts or around anyone with similar symptoms.  This is not a patient denies any nausea, vomiting, abdominal pain, chest pain, shortness of breath.   Observations/Objective: Vitals:   10/20/22 1032  Temp: (!) 101 F (38.3 C)   Gen: NAD, resting comfortably CV: RRR with no murmurs appreciated Pulm: NWOB, CTAB with no crackles, wheezes, or rhonchi GI: Normal bowel sounds present. Soft, Nontender, Nondistended. MSK: no edema, cyanosis, or clubbing noted Skin: warm, dry Neuro: grossly normal, moves all extremities Psych: Normal affect and thought content   Assessment and Plan:  Dysuria.  Given new onset of dysuria in the setting of fevers and chills, concern for possible UTI or pyelonephritis.  Given the limitation of virtual care, recommend patient be seen in person to seen in to assess  clinically, have vitals, urinalysis, viral testing for COVID and flu, etc. and to ensure patient does not clinically worsen or deteriorate.  Patient stated that he would come to be seen in person.  Office to call and set up appointment, if unable recommend patient to go to urgent care.  Follow Up Instructions:    I discussed the assessment and treatment plan with the patient. The patient was provided an opportunity to ask questions and all were answered. The patient agreed with the plan and demonstrated an understanding of the instructions.   The patient was advised to call back or seek an in-person evaluation if the symptoms worsen or if the condition fails to improve as anticipated.  I provided 10 minutes minutes of non-face-to-face time during this encounter.   Garnette Gunner, MD

## 2022-11-29 ENCOUNTER — Other Ambulatory Visit: Payer: Self-pay | Admitting: Family Medicine

## 2022-11-29 DIAGNOSIS — M130 Polyarthritis, unspecified: Secondary | ICD-10-CM

## 2022-12-12 DIAGNOSIS — H43813 Vitreous degeneration, bilateral: Secondary | ICD-10-CM | POA: Diagnosis not present

## 2022-12-12 DIAGNOSIS — H2513 Age-related nuclear cataract, bilateral: Secondary | ICD-10-CM | POA: Diagnosis not present

## 2023-01-27 ENCOUNTER — Other Ambulatory Visit: Payer: Self-pay | Admitting: Family Medicine

## 2023-01-27 DIAGNOSIS — M130 Polyarthritis, unspecified: Secondary | ICD-10-CM

## 2023-04-05 ENCOUNTER — Other Ambulatory Visit: Payer: Self-pay | Admitting: Family Medicine

## 2023-04-05 DIAGNOSIS — M130 Polyarthritis, unspecified: Secondary | ICD-10-CM

## 2023-06-05 ENCOUNTER — Other Ambulatory Visit: Payer: Self-pay | Admitting: Family Medicine

## 2023-06-05 DIAGNOSIS — M130 Polyarthritis, unspecified: Secondary | ICD-10-CM

## 2023-07-10 DIAGNOSIS — M9903 Segmental and somatic dysfunction of lumbar region: Secondary | ICD-10-CM | POA: Diagnosis not present

## 2023-07-10 DIAGNOSIS — M62830 Muscle spasm of back: Secondary | ICD-10-CM | POA: Diagnosis not present

## 2023-07-10 DIAGNOSIS — M5441 Lumbago with sciatica, right side: Secondary | ICD-10-CM | POA: Diagnosis not present

## 2023-07-10 DIAGNOSIS — M9904 Segmental and somatic dysfunction of sacral region: Secondary | ICD-10-CM | POA: Diagnosis not present

## 2023-08-05 ENCOUNTER — Other Ambulatory Visit: Payer: Self-pay | Admitting: Family Medicine

## 2023-08-05 DIAGNOSIS — M130 Polyarthritis, unspecified: Secondary | ICD-10-CM

## 2023-08-20 ENCOUNTER — Encounter: Payer: Self-pay | Admitting: Pharmacist

## 2023-08-20 NOTE — Progress Notes (Signed)
Pharmacy Quality Measure Review  This patient is appearing on a report for being at risk of failing the adherence measure for cholesterol (statin) medications this calendar year.   Medication: atorvastatin 10 mg  Last fill date: 4/15 for 90 day supply  Attempted to outreach patient to discuss adherence to atorvastatin. Unable to leave voicemail. Appears patient needs to schedule appt with PCP and is out of refills on atorvastatin. Will f/u with PCP.  Jarrett Ables, PharmD PGY-1 Pharmacy Resident

## 2023-08-23 DIAGNOSIS — M62830 Muscle spasm of back: Secondary | ICD-10-CM | POA: Diagnosis not present

## 2023-08-23 DIAGNOSIS — M5442 Lumbago with sciatica, left side: Secondary | ICD-10-CM | POA: Diagnosis not present

## 2023-08-23 DIAGNOSIS — M9904 Segmental and somatic dysfunction of sacral region: Secondary | ICD-10-CM | POA: Diagnosis not present

## 2023-08-23 DIAGNOSIS — M9903 Segmental and somatic dysfunction of lumbar region: Secondary | ICD-10-CM | POA: Diagnosis not present

## 2023-08-23 DIAGNOSIS — M25562 Pain in left knee: Secondary | ICD-10-CM | POA: Diagnosis not present

## 2023-08-27 ENCOUNTER — Other Ambulatory Visit: Payer: Self-pay | Admitting: Family Medicine

## 2023-08-27 DIAGNOSIS — K219 Gastro-esophageal reflux disease without esophagitis: Secondary | ICD-10-CM

## 2023-08-27 DIAGNOSIS — K2900 Acute gastritis without bleeding: Secondary | ICD-10-CM

## 2023-08-29 ENCOUNTER — Other Ambulatory Visit: Payer: Self-pay | Admitting: Family Medicine

## 2023-08-29 DIAGNOSIS — Z9189 Other specified personal risk factors, not elsewhere classified: Secondary | ICD-10-CM

## 2023-08-30 ENCOUNTER — Other Ambulatory Visit: Payer: Self-pay | Admitting: Family Medicine

## 2023-08-30 DIAGNOSIS — M130 Polyarthritis, unspecified: Secondary | ICD-10-CM

## 2023-08-30 MED ORDER — MELOXICAM 15 MG PO TABS
15.0000 mg | ORAL_TABLET | Freq: Every day | ORAL | 0 refills | Status: DC | PRN
Start: 2023-08-30 — End: 2024-01-29

## 2023-09-21 ENCOUNTER — Encounter: Payer: Self-pay | Admitting: Family Medicine

## 2023-09-21 ENCOUNTER — Ambulatory Visit (INDEPENDENT_AMBULATORY_CARE_PROVIDER_SITE_OTHER): Payer: PPO | Admitting: Family Medicine

## 2023-09-21 VITALS — BP 120/66 | HR 52 | Temp 97.7°F | Wt 173.6 lb

## 2023-09-21 DIAGNOSIS — R42 Dizziness and giddiness: Secondary | ICD-10-CM | POA: Diagnosis not present

## 2023-09-21 DIAGNOSIS — Z8739 Personal history of other diseases of the musculoskeletal system and connective tissue: Secondary | ICD-10-CM

## 2023-09-21 DIAGNOSIS — M130 Polyarthritis, unspecified: Secondary | ICD-10-CM

## 2023-09-21 DIAGNOSIS — E782 Mixed hyperlipidemia: Secondary | ICD-10-CM | POA: Diagnosis not present

## 2023-09-21 LAB — URINALYSIS, ROUTINE W REFLEX MICROSCOPIC
Bilirubin Urine: NEGATIVE
Hgb urine dipstick: NEGATIVE
Ketones, ur: NEGATIVE
Leukocytes,Ua: NEGATIVE
Nitrite: NEGATIVE
RBC / HPF: NONE SEEN (ref 0–?)
Specific Gravity, Urine: 1.01 (ref 1.000–1.030)
Total Protein, Urine: NEGATIVE
Urine Glucose: NEGATIVE
Urobilinogen, UA: 0.2 (ref 0.0–1.0)
WBC, UA: NONE SEEN (ref 0–?)
pH: 6.5 (ref 5.0–8.0)

## 2023-09-21 LAB — COMPREHENSIVE METABOLIC PANEL
ALT: 18 U/L (ref 0–53)
AST: 18 U/L (ref 0–37)
Albumin: 4.4 g/dL (ref 3.5–5.2)
Alkaline Phosphatase: 77 U/L (ref 39–117)
BUN: 20 mg/dL (ref 6–23)
CO2: 31 meq/L (ref 19–32)
Calcium: 9.3 mg/dL (ref 8.4–10.5)
Chloride: 103 meq/L (ref 96–112)
Creatinine, Ser: 0.97 mg/dL (ref 0.40–1.50)
GFR: 74.25 mL/min (ref >60.00)
Glucose, Bld: 104 mg/dL — ABNORMAL HIGH (ref 70–99)
Potassium: 4.4 meq/L (ref 3.5–5.1)
Sodium: 141 meq/L (ref 135–145)
Total Bilirubin: 1.1 mg/dL (ref 0.2–1.2)
Total Protein: 6.9 g/dL (ref 6.0–8.3)

## 2023-09-21 LAB — CBC
HCT: 46.8 % (ref 39.0–52.0)
Hemoglobin: 15.8 g/dL (ref 13.0–17.0)
MCHC: 33.7 g/dL (ref 30.0–36.0)
MCV: 92.6 fL (ref 78.0–100.0)
Platelets: 237 10*3/uL (ref 150.0–400.0)
RBC: 5.06 Mil/uL (ref 4.22–5.81)
RDW: 13.1 % (ref 11.5–15.5)
WBC: 5.3 10*3/uL (ref 4.0–10.5)

## 2023-09-21 LAB — URIC ACID: Uric Acid, Serum: 6.6 mg/dL (ref 4.0–7.8)

## 2023-09-21 LAB — LDL CHOLESTEROL, DIRECT: Direct LDL: 68 mg/dL

## 2023-09-21 NOTE — Progress Notes (Signed)
   Established Patient Office Visit   Subjective:  Patient ID: Gary Case, male    DOB: 15-Jan-1946  Age: 77 y.o. MRN: [id number]  Chief Complaint  Patient presents with   Dizziness    Intermittent for a month feels like he is spinning has this feeling when doing sit ups and/or sitting     Dizziness Pertinent negatives include no abdominal pain, headaches, myalgias, rash or weakness.   Encounter Diagnoses  Name Primary?   Lightheadedness Yes   Arthritis of multiple sites    History of gout    Mixed hyperlipidemia    Describes frequent episodes of waves of lightheadedness when he is doing sit ups or goes to stand up.  Feels as though he is well-hydrated.  There has been no spinning sensation.  He honestly requires the meloxicam daily for multiple joint pains and aches.  He denies melena or blood in his stool.  GERD is controlled with pantoprazole.  He claims compliance with the atorvastatin.    Review of Systems  Constitutional: Negative.   HENT: Negative.    Eyes:  Negative for blurred vision, discharge and redness.  Respiratory: Negative.    Cardiovascular: Negative.   Gastrointestinal:  Negative for abdominal pain, blood in stool and melena.  Genitourinary: Negative.   Musculoskeletal:  Positive for joint pain. Negative for myalgias.  Skin:  Negative for rash.  Neurological:  Negative for dizziness, tingling, loss of consciousness, weakness and headaches.  Endo/Heme/Allergies:  Negative for polydipsia.     Current Outpatient Medications:    acetaminophen (TYLENOL) 650 MG CR tablet, Take 650 mg by mouth every 8 (eight) hours as needed for pain., Disp: , Rfl:    atorvastatin (LIPITOR) 10 MG tablet, TAKE 1 TABLET BY MOUTH EVERY DAY, Disp: 90 tablet, Rfl: 0   meclizine (ANTIVERT) 25 MG tablet, Take 1 tablet (25 mg total) by mouth 3 (three) times daily as needed for dizziness., Disp: 30 tablet, Rfl: 0   meloxicam (MOBIC) 15 MG tablet, Take 1 tablet (15 mg total) by mouth  daily as needed., Disp: 90 tablet, Rfl: 0   pantoprazole (PROTONIX) 40 MG tablet, TAKE 1 TABLET BY MOUTH EVERY DAY, Disp: 90 tablet, Rfl: 3   Objective:     BP 120/66   Pulse (!) 52   Temp 97.7 F (36.5 C) (Temporal)   Wt 173 lb 9.6 oz (78.7 kg)   SpO2 97%   BMI 28.89 kg/m    Physical Exam   No results found for any visits on 09/21/23.    The 10-year ASCVD risk score (Arnett DK, et al., 2019) is: 26.5%    Assessment & Plan:   Lightheadedness -     CBC -     Urinalysis, Routine w reflex microscopic  Arthritis of multiple sites  History of gout -     Comprehensive metabolic panel -     Uric acid  Mixed hyperlipidemia -     LDL cholesterol, direct    Return in about 3 months (around 12/22/2023).  Patient did not to help with orthostatics.  Labs are pending.  Suspect dehydration.  Encouraged adequate fluid intake.  Patient declines vaccinations for COVID, flu and RSV.  Encouraged him to reconsider.  He is interested in the Shingrix vaccine and will have that through his pharmacy.  [name], MD

## 2023-10-04 DIAGNOSIS — M25562 Pain in left knee: Secondary | ICD-10-CM | POA: Diagnosis not present

## 2023-10-04 DIAGNOSIS — M9903 Segmental and somatic dysfunction of lumbar region: Secondary | ICD-10-CM | POA: Diagnosis not present

## 2023-10-04 DIAGNOSIS — M5442 Lumbago with sciatica, left side: Secondary | ICD-10-CM | POA: Diagnosis not present

## 2023-10-04 DIAGNOSIS — M9904 Segmental and somatic dysfunction of sacral region: Secondary | ICD-10-CM | POA: Diagnosis not present

## 2023-10-04 DIAGNOSIS — M62830 Muscle spasm of back: Secondary | ICD-10-CM | POA: Diagnosis not present

## 2023-11-13 DIAGNOSIS — M5442 Lumbago with sciatica, left side: Secondary | ICD-10-CM | POA: Diagnosis not present

## 2023-11-13 DIAGNOSIS — M62830 Muscle spasm of back: Secondary | ICD-10-CM | POA: Diagnosis not present

## 2023-11-13 DIAGNOSIS — M9904 Segmental and somatic dysfunction of sacral region: Secondary | ICD-10-CM | POA: Diagnosis not present

## 2023-11-13 DIAGNOSIS — M25562 Pain in left knee: Secondary | ICD-10-CM | POA: Diagnosis not present

## 2023-11-13 DIAGNOSIS — M9903 Segmental and somatic dysfunction of lumbar region: Secondary | ICD-10-CM | POA: Diagnosis not present

## 2023-11-24 ENCOUNTER — Other Ambulatory Visit: Payer: Self-pay | Admitting: Family Medicine

## 2023-11-24 DIAGNOSIS — Z9189 Other specified personal risk factors, not elsewhere classified: Secondary | ICD-10-CM

## 2023-11-30 DIAGNOSIS — H43813 Vitreous degeneration, bilateral: Secondary | ICD-10-CM | POA: Diagnosis not present

## 2023-11-30 DIAGNOSIS — H2513 Age-related nuclear cataract, bilateral: Secondary | ICD-10-CM | POA: Diagnosis not present

## 2024-01-20 ENCOUNTER — Other Ambulatory Visit: Payer: Self-pay | Admitting: Family Medicine

## 2024-01-20 DIAGNOSIS — M130 Polyarthritis, unspecified: Secondary | ICD-10-CM

## 2024-01-28 ENCOUNTER — Other Ambulatory Visit: Payer: Self-pay | Admitting: Family Medicine

## 2024-01-28 DIAGNOSIS — Z9189 Other specified personal risk factors, not elsewhere classified: Secondary | ICD-10-CM

## 2024-01-28 DIAGNOSIS — M130 Polyarthritis, unspecified: Secondary | ICD-10-CM

## 2024-01-29 ENCOUNTER — Other Ambulatory Visit: Payer: Self-pay

## 2024-01-29 DIAGNOSIS — Z9189 Other specified personal risk factors, not elsewhere classified: Secondary | ICD-10-CM

## 2024-01-29 MED ORDER — ATORVASTATIN CALCIUM 10 MG PO TABS: 10.0000 mg | ORAL_TABLET | Freq: Every day | ORAL | 0 refills | Status: DC

## 2024-02-01 IMAGING — DX DG HIP (WITH OR WITHOUT PELVIS) 2-3V*L*
2 series · 2 of 2 positions shown · non-contrast
Comparison: No prior.

CLINICAL DATA: Decreased range of motion.

EXAM:
DG HIP (WITH OR WITHOUT PELVIS) 2-3V LEFT

[pelvis ap]
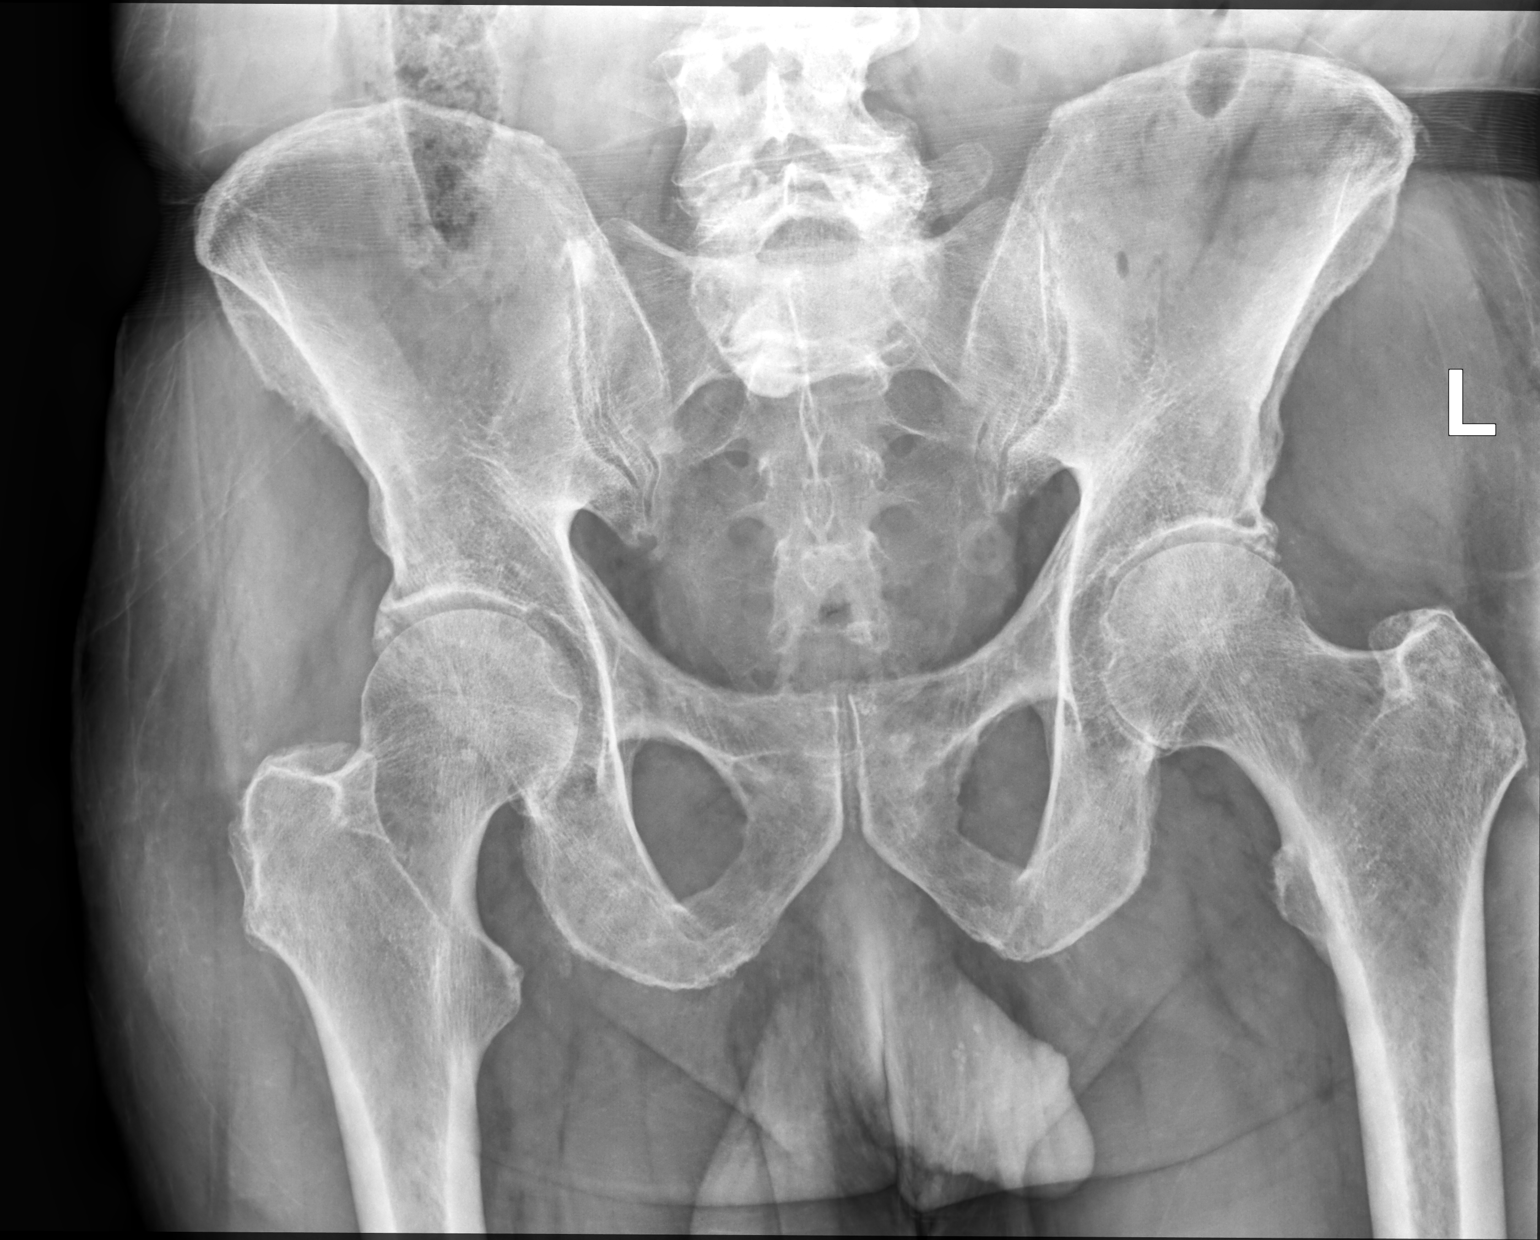

[hip frog leg lat]
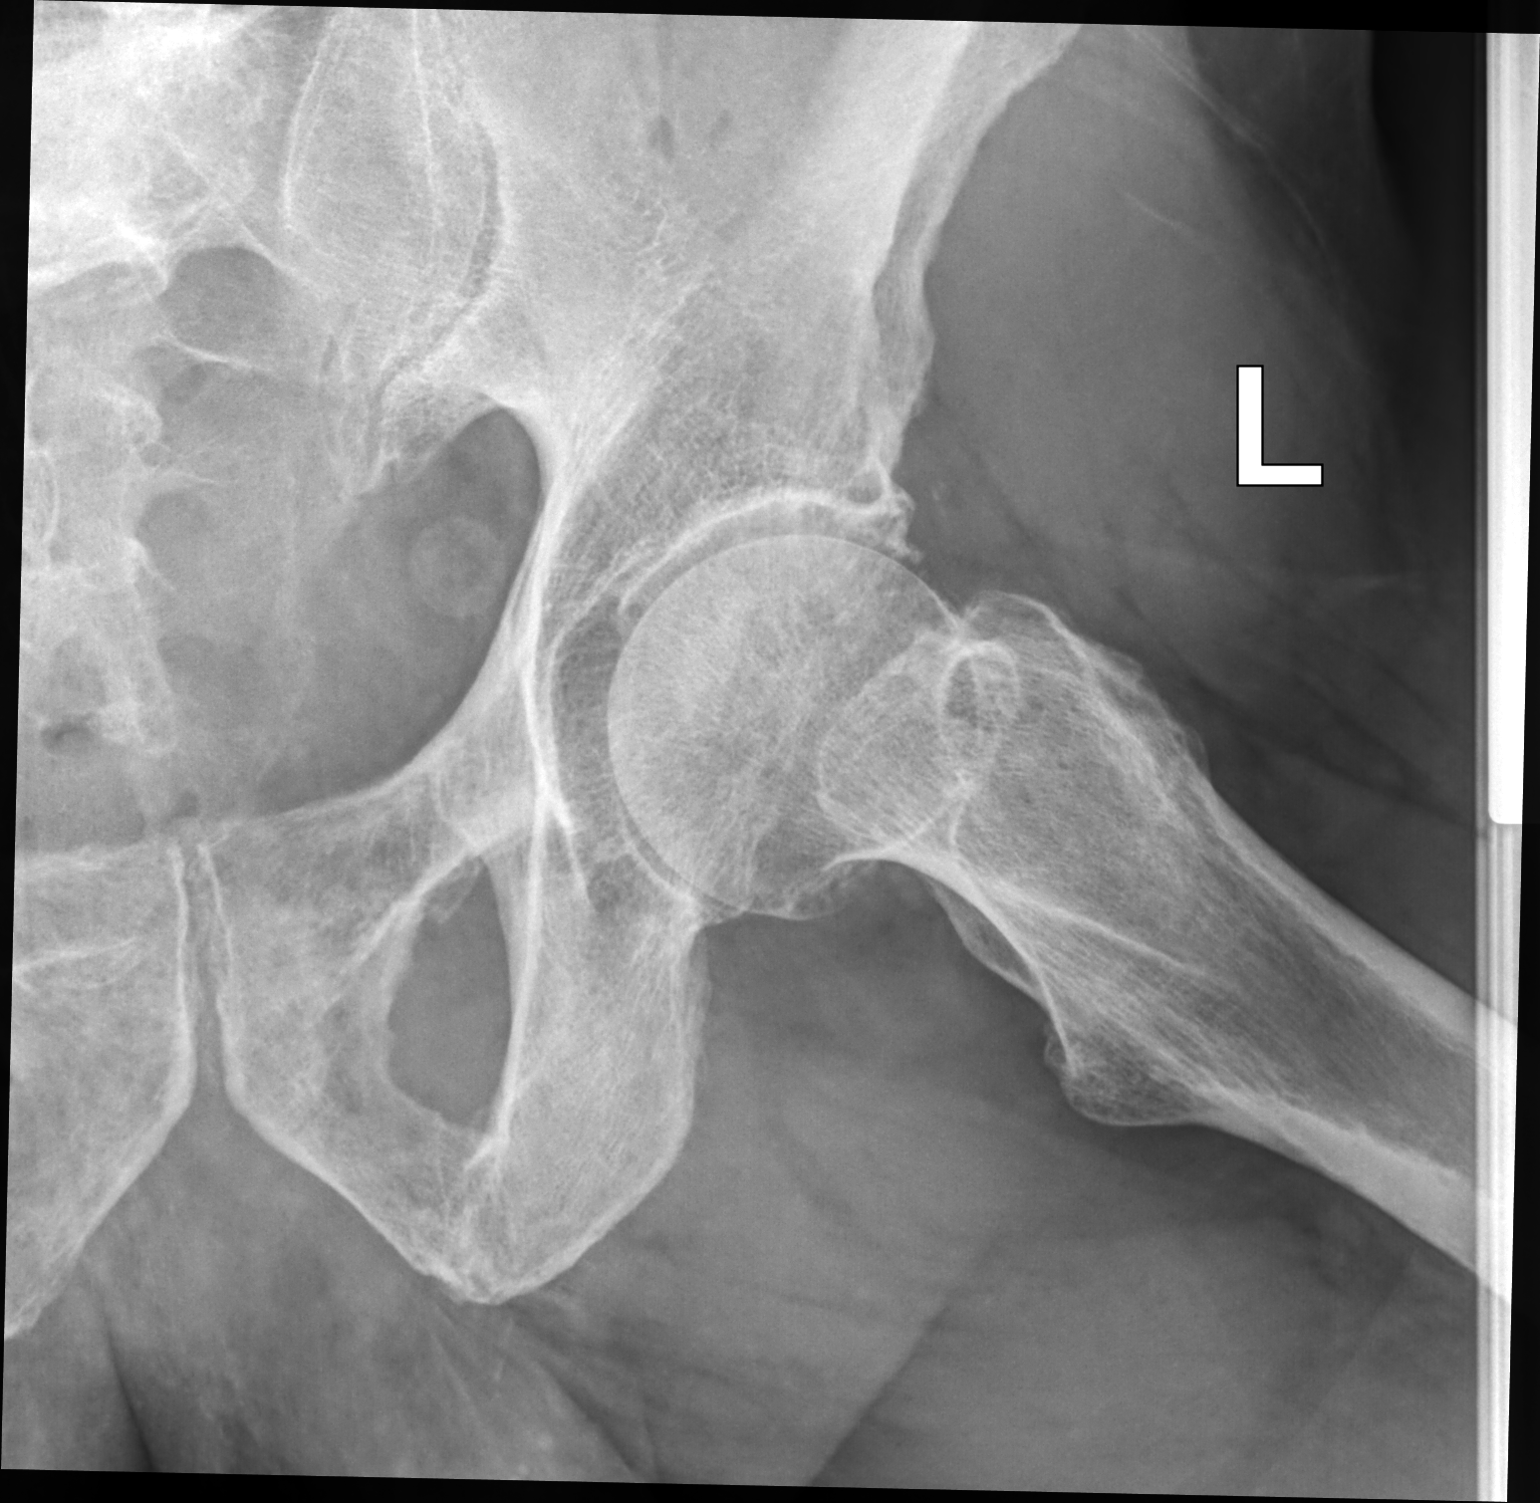

[2 of 2 positions shown; findings below may reference images not displayed]

FINDINGS: Degenerative changes lower lumbar spine and both hips. Tiny bony
densities noted adjacent to the acetabuli are most likely
degenerative. Multiple sclerotic densities noted and are noted over
the sacrum, the left ilium, the left pubis, and both proximal
femurs. Although these may represent benign bone islands to exclude
blastic metastatic disease whole-body bone scan suggested. No acute
bony or joint abnormalities identified. Left hip is intact. No
evidence of fracture or dislocation. Peroneal calcifications
consistent with phleboliths noted.
IMPRESSION: 1. Multiple sclerotic densities noted and are present over the
sacrum, the left ilium, the left pubis, and both proximal femurs.
Although these may represent benign bone islands, to exclude blastic
metastatic disease whole-body bone scan suggested.

2. Degenerative changes lower lumbar spine and both hips. Left hip
is intact. No acute bony abnormality is identified. No evidence of
fracture or dislocation.

## 2024-02-01 IMAGING — DX DG KNEE COMPLETE 4+V*L*
4 series · 4 of 4 positions shown · non-contrast
Comparison: No prior.

CLINICAL DATA: Left knee pain.  No known injury.

EXAM:
LEFT KNEE - COMPLETE 4+ VIEW

[knee ap]
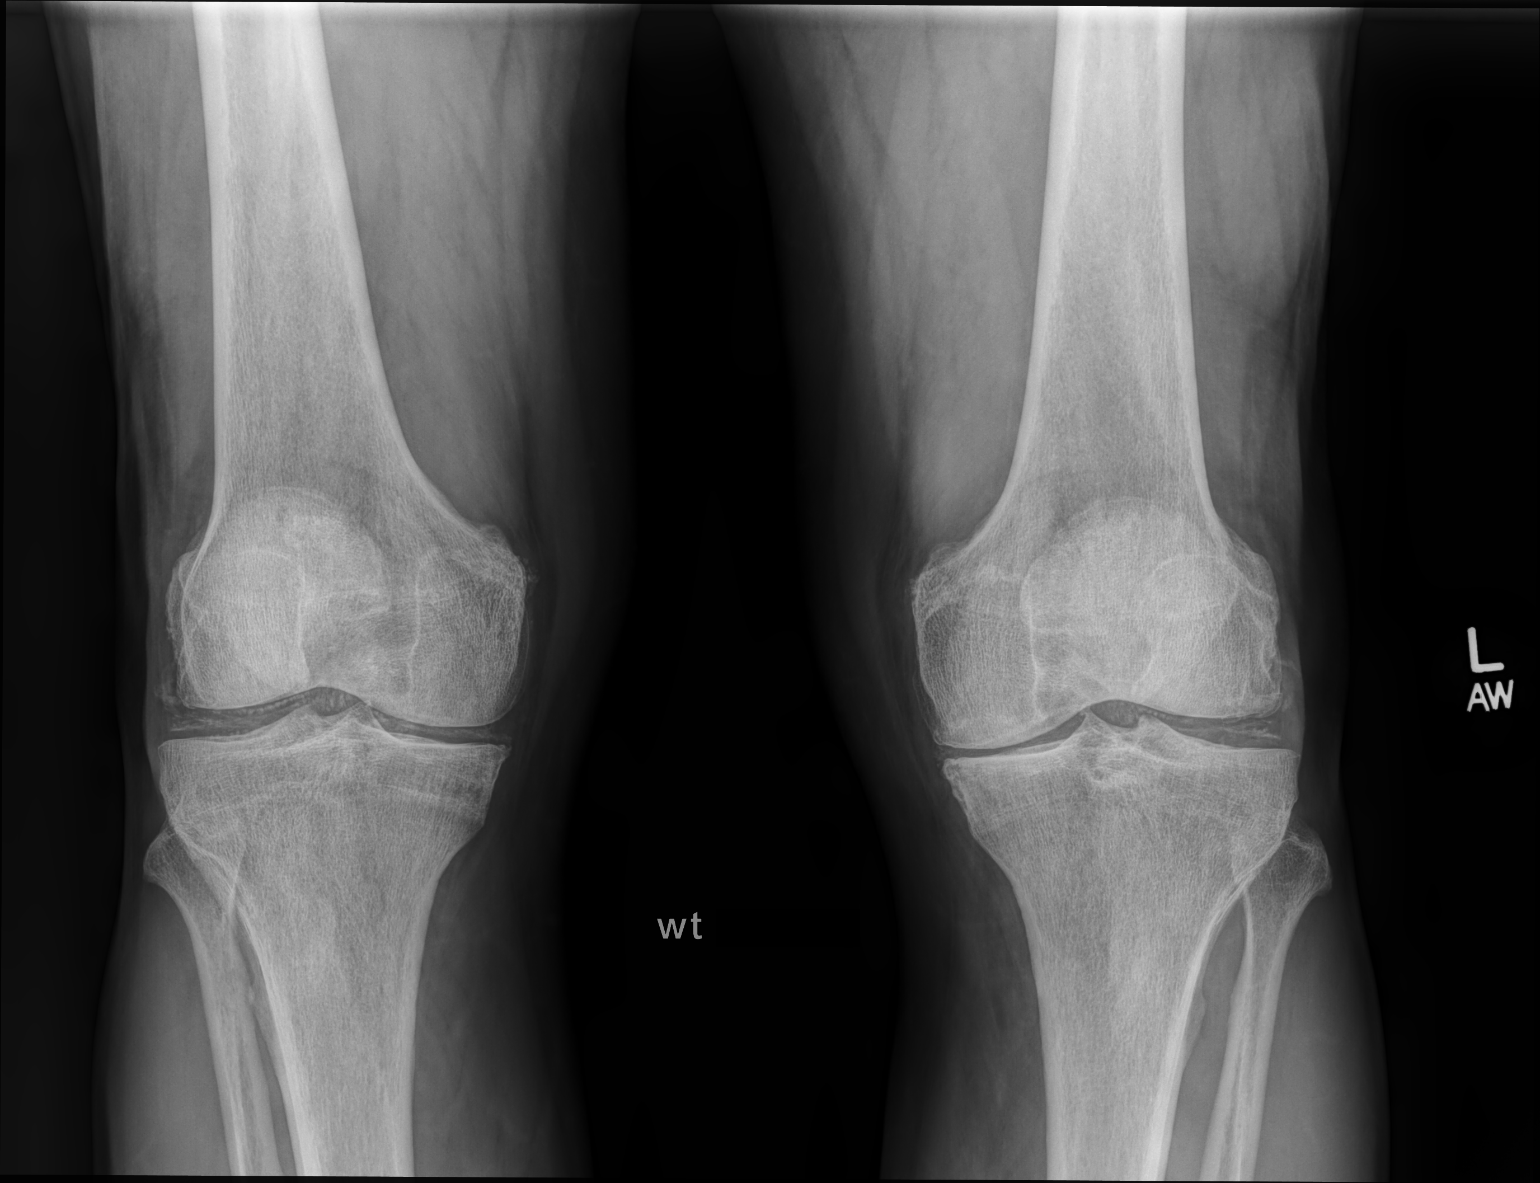

[knee [person_name] view pa]
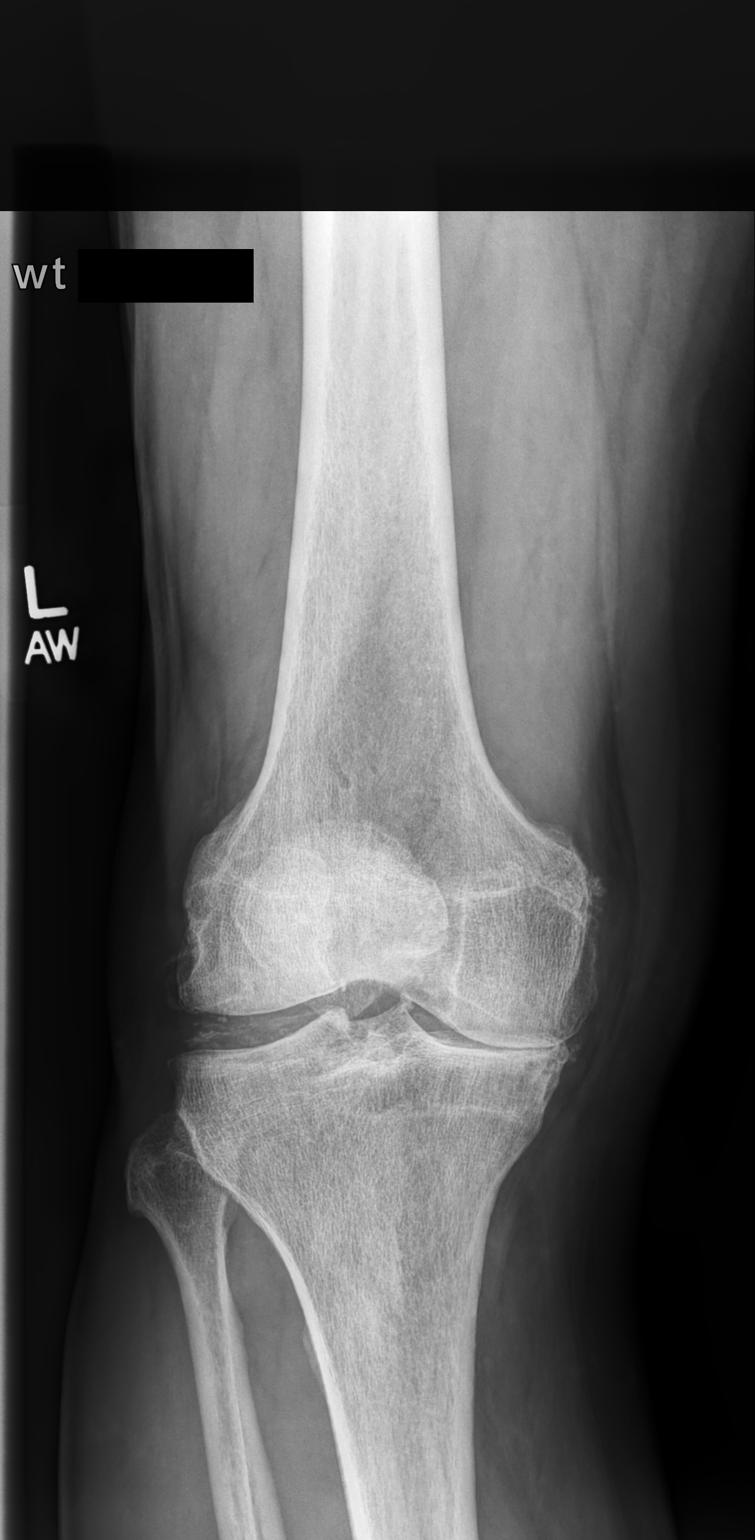

[knee lat]
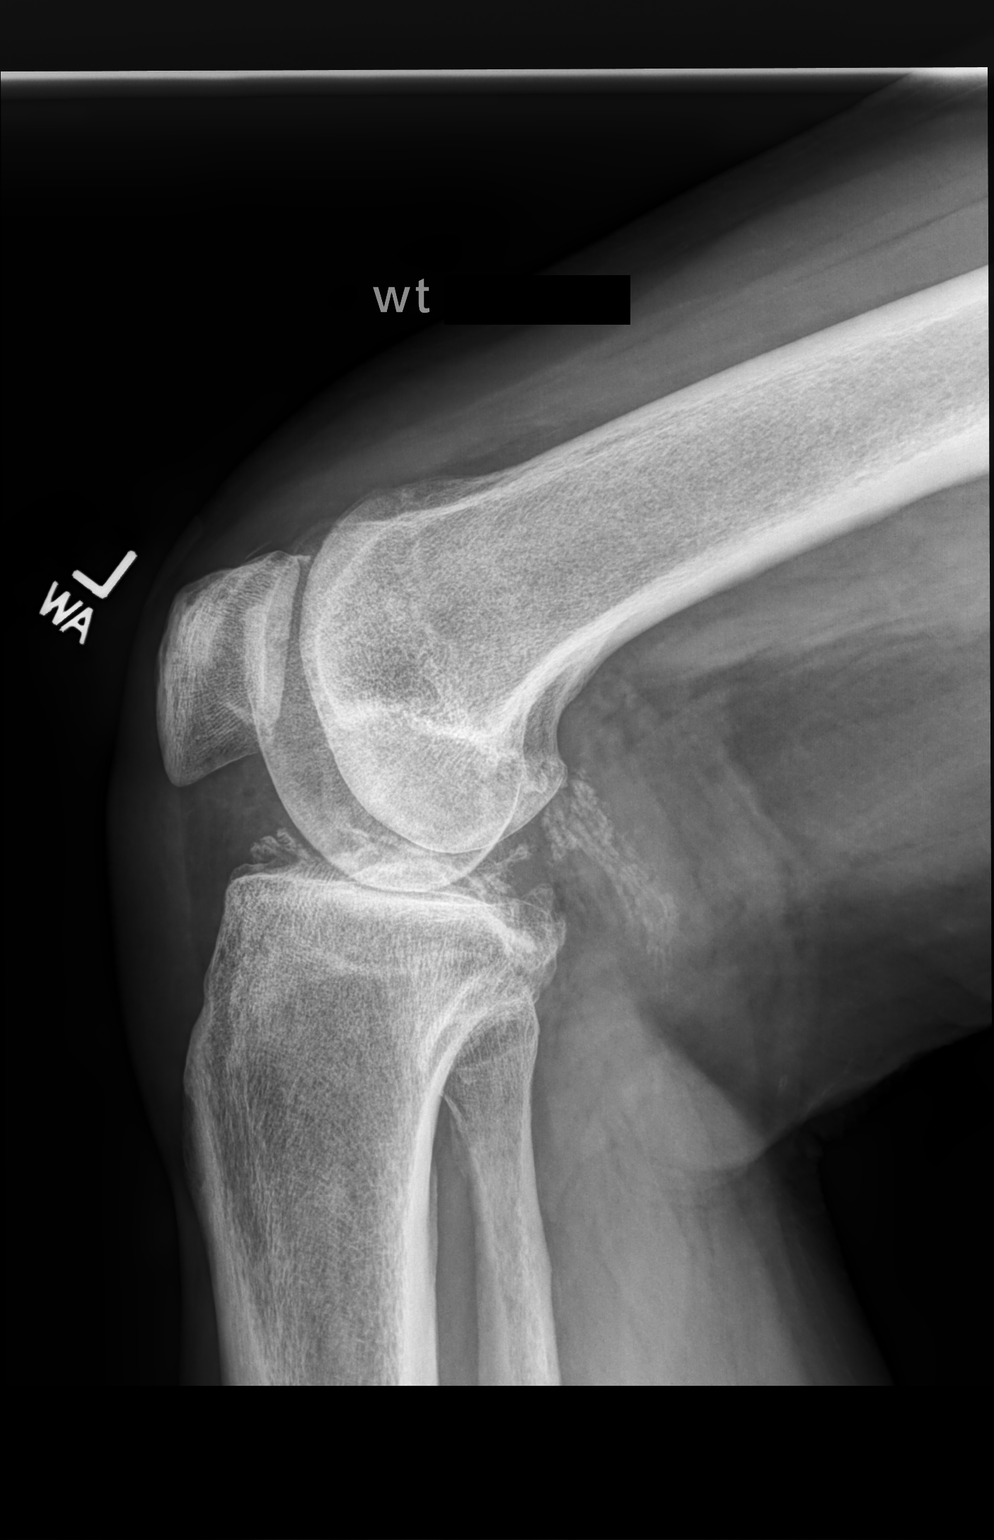

[patella (sunrise)]
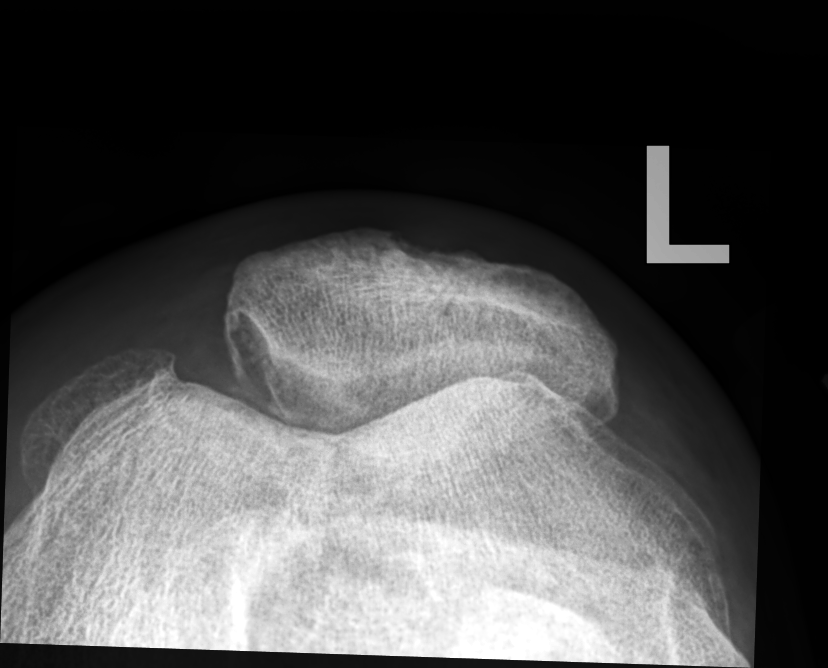

[4 of 4 positions shown; findings below may reference images not displayed]

FINDINGS: Bilateral diffuse degenerative change with chondrocalcinosis. No
acute bony abnormality identified. Small calcific densities noted
adjacent to medial femoral condyles bilaterally most likely related
to prior ligamentous injury. Tiny linear well-circumscribed bony
density noted along the superior aspect of the left patella,
possibly related to old injury. Calcific changes noted over the left
popliteal space. This could be related to prior injury. An active
synovial process cannot be excluded and MRI of the left knee
suggested for further evaluation. Tiny left knee joint effusion
cannot be excluded.
IMPRESSION: 1. Bilateral diffuse degenerative change with chondrocalcinosis.
Small calcific densities noted adjacent to the medial femoral
condyles bilaterally most likely related to prior ligamentous
injury. Tiny linear well-circumscribed bony density noted along the
superior aspect of the left patella possibly related to old injury.
No acute bony abnormality identified.

2. Calcific changes noted over the left popliteal space. This could
be related to prior injury. An active synovial process cannot be
excluded and MRI of the left knee suggested for further evaluation.
Tiny left knee joint effusion cannot be excluded.

## 2024-02-25 ENCOUNTER — Other Ambulatory Visit: Payer: Self-pay | Admitting: Family Medicine

## 2024-02-25 DIAGNOSIS — M130 Polyarthritis, unspecified: Secondary | ICD-10-CM

## 2024-03-23 ENCOUNTER — Other Ambulatory Visit: Payer: Self-pay | Admitting: Family Medicine

## 2024-03-23 DIAGNOSIS — M130 Polyarthritis, unspecified: Secondary | ICD-10-CM

## 2024-04-01 ENCOUNTER — Ambulatory Visit: Admitting: Family Medicine

## 2024-04-04 ENCOUNTER — Ambulatory Visit (INDEPENDENT_AMBULATORY_CARE_PROVIDER_SITE_OTHER): Admitting: Family Medicine

## 2024-04-04 ENCOUNTER — Ambulatory Visit: Payer: Self-pay | Admitting: Family Medicine

## 2024-04-04 VITALS — BP 120/80 | HR 57 | Temp 97.3°F | Ht 65.0 in | Wt 174.0 lb

## 2024-04-04 DIAGNOSIS — M130 Polyarthritis, unspecified: Secondary | ICD-10-CM

## 2024-04-04 DIAGNOSIS — E782 Mixed hyperlipidemia: Secondary | ICD-10-CM | POA: Diagnosis not present

## 2024-04-04 DIAGNOSIS — K219 Gastro-esophageal reflux disease without esophagitis: Secondary | ICD-10-CM

## 2024-04-04 DIAGNOSIS — E538 Deficiency of other specified B group vitamins: Secondary | ICD-10-CM | POA: Diagnosis not present

## 2024-04-04 DIAGNOSIS — R7309 Other abnormal glucose: Secondary | ICD-10-CM

## 2024-04-04 LAB — BASIC METABOLIC PANEL WITH GFR
BUN: 13 mg/dL (ref 6–23)
CO2: 31 meq/L (ref 19–32)
Calcium: 9.3 mg/dL (ref 8.4–10.5)
Chloride: 105 meq/L (ref 96–112)
Creatinine, Ser: 0.84 mg/dL (ref 0.40–1.50)
GFR: 82.63 mL/min (ref >60.00)
Glucose, Bld: 110 mg/dL — ABNORMAL HIGH (ref 70–99)
Potassium: 4.1 meq/L (ref 3.5–5.1)
Sodium: 141 meq/L (ref 135–145)

## 2024-04-04 LAB — LIPID PANEL
Cholesterol: 113 mg/dL (ref 0–200)
HDL: 48.9 mg/dL (ref >39.00)
LDL Cholesterol: 50 mg/dL (ref 0–99)
NonHDL: 63.61
Total CHOL/HDL Ratio: 2
Triglycerides: 70 mg/dL (ref 0.0–149.0)
VLDL: 14 mg/dL (ref 0.0–40.0)

## 2024-04-04 LAB — VITAMIN B12: Vitamin B-12: 779 pg/mL (ref 211–911)

## 2024-04-04 LAB — HEMOGLOBIN A1C: Hgb A1c MFr Bld: 5.7 % (ref 4.6–6.5)

## 2024-04-04 MED ORDER — MELOXICAM 15 MG PO TABS: 15.0000 mg | ORAL_TABLET | Freq: Every day | ORAL | 1 refills | Status: DC | PRN

## 2024-04-04 NOTE — Progress Notes (Signed)
 Established Patient Office Visit   Subjective:  Patient ID: Gary Case, male    DOB: 11/21/1945  Age: 78 y.o. MRN: [id number]  Chief Complaint  Patient presents with   Medication Refill    Meloxicam  refill    Medication Refill Pertinent negatives include no abdominal pain, myalgias, rash or weakness.   Encounter Diagnoses  Name Primary?   Arthritis of multiple sites Yes   Mixed hyperlipidemia    Gastroesophageal reflux disease, unspecified whether esophagitis present    B12 deficiency    Elevated glucose    For follow-up of above.  Meloxicam  has been working well for arthritic aches and pains including his lower back hips and knees.  He is having no issues taking it.  GERD is well-controlled.  Continues atorvastatin  10 mg elevated ldl cholesterol.   Review of Systems  Constitutional: Negative.   HENT: Negative.    Eyes:  Negative for blurred vision, discharge and redness.  Respiratory: Negative.    Cardiovascular: Negative.   Gastrointestinal:  Negative for abdominal pain.  Genitourinary: Negative.   Musculoskeletal:  Positive for back pain and joint pain. Negative for myalgias.  Skin:  Negative for rash.  Neurological:  Negative for tingling, loss of consciousness and weakness.  Endo/Heme/Allergies:  Negative for polydipsia.     Current Outpatient Medications:    acetaminophen (TYLENOL) 650 MG CR tablet, Take 650 mg by mouth every 8 (eight) hours as needed for pain., Disp: , Rfl:    atorvastatin  (LIPITOR) 10 MG tablet, Take 1 tablet (10 mg total) by mouth daily., Disp: 90 tablet, Rfl: 0   meclizine  (ANTIVERT ) 25 MG tablet, Take 1 tablet (25 mg total) by mouth 3 (three) times daily as needed for dizziness., Disp: 30 tablet, Rfl: 0   pantoprazole  (PROTONIX ) 40 MG tablet, TAKE 1 TABLET BY MOUTH EVERY DAY, Disp: 90 tablet, Rfl: 3   meloxicam  (MOBIC ) 15 MG tablet, Take 1 tablet (15 mg total) by mouth daily as needed., Disp: 90 tablet, Rfl: 1   Objective:     BP  120/80   Pulse (!) 57   Temp (!) 97.3 F (36.3 C) (Temporal)   Ht 5\' 5"  (1.651 m)   Wt 174 lb (78.9 kg)   SpO2 99%   BMI 28.96 kg/m    Physical Exam Constitutional:      General: He is not in acute distress.    Appearance: Normal appearance. He is not ill-appearing, toxic-appearing or diaphoretic.  HENT:     Head: Normocephalic and atraumatic.     Right Ear: External ear normal.     Left Ear: External ear normal.     Mouth/Throat:     Mouth: Mucous membranes are moist.     Pharynx: Oropharynx is clear. No oropharyngeal exudate or posterior oropharyngeal erythema.  Eyes:     General: No scleral icterus.       Right eye: No discharge.        Left eye: No discharge.     Extraocular Movements: Extraocular movements intact.     Conjunctiva/sclera: Conjunctivae normal.     Pupils: Pupils are equal, round, and reactive to light.  Cardiovascular:     Rate and Rhythm: Normal rate and regular rhythm.  Pulmonary:     Effort: Pulmonary effort is normal. No respiratory distress.     Breath sounds: Normal breath sounds. No wheezing, rhonchi or rales.  Musculoskeletal:     Cervical back: No rigidity or tenderness.  Skin:    General: Skin is  warm and dry.  Neurological:     Mental Status: He is alert and oriented to person, place, and time.  Psychiatric:        Mood and Affect: Mood normal.        Behavior: Behavior normal.      No results found for any visits on 04/04/24.    The ASCVD Risk score (Arnett DK, et al., 2019) failed to calculate for the following reasons:   The 2019 ASCVD risk score is only valid for ages 60 to 39    Assessment & Plan:   Arthritis of multiple sites -     Meloxicam ; Take 1 tablet (15 mg total) by mouth daily as needed.  Dispense: 90 tablet; Refill: 1  Mixed hyperlipidemia -     Lipid panel  Gastroesophageal reflux disease, unspecified whether esophagitis present  B12 deficiency -     Vitamin B12  Elevated glucose -     Basic metabolic  panel with GFR -     Hemoglobin A1c    Return in about 6 months (around 10/05/2024), or if symptoms worsen or fail to improve.  Discussed risks of taking meloxicam  for the long-term to include increased risk of vascular events.  The medication is working well for him.  He would like to continue it and I agree.  Tonna Frederic, MD

## 2024-05-25 ENCOUNTER — Other Ambulatory Visit: Payer: Self-pay | Admitting: Family Medicine

## 2024-05-25 DIAGNOSIS — Z9189 Other specified personal risk factors, not elsewhere classified: Secondary | ICD-10-CM

## 2024-06-04 ENCOUNTER — Ambulatory Visit: Payer: Self-pay

## 2024-06-04 ENCOUNTER — Encounter: Payer: Self-pay | Admitting: Nurse Practitioner

## 2024-06-04 ENCOUNTER — Ambulatory Visit (INDEPENDENT_AMBULATORY_CARE_PROVIDER_SITE_OTHER): Admitting: Nurse Practitioner

## 2024-06-04 ENCOUNTER — Telehealth: Payer: Self-pay | Admitting: Nurse Practitioner

## 2024-06-04 VITALS — BP 124/78 | HR 53 | Temp 98.7°F | Wt 175.0 lb

## 2024-06-04 DIAGNOSIS — R6884 Jaw pain: Secondary | ICD-10-CM | POA: Diagnosis not present

## 2024-06-04 DIAGNOSIS — Z965 Presence of tooth-root and mandibular implants: Secondary | ICD-10-CM | POA: Diagnosis not present

## 2024-06-04 MED ORDER — CHLORHEXIDINE GLUCONATE 0.12 % MT SOLN: 15.0000 mL | Freq: Two times a day (BID) | OROMUCOSAL | 0 refills | Status: DC

## 2024-06-04 MED ORDER — CLINDAMYCIN HCL 150 MG PO CAPS: 150.0000 mg | ORAL_CAPSULE | Freq: Three times a day (TID) | ORAL | 0 refills | Status: AC

## 2024-06-04 NOTE — Progress Notes (Signed)
 Established Patient Visit  Patient: Gary Case   DOB: 1946/09/20   78 y.o. Male  MRN: [id number] Visit Date: 06/04/2024  Subjective:    Chief Complaint  Patient presents with   Jaw Pain   HPI Left lower anterior Jaw pain and swelling : onset several months ago, waxing and waning, states he was evaluated by prosthodontist in [place] 2weeks ago, Amoxicillin was prescribed and completed (unknown dose 1tab TID x 1week). Symptoms improved with amoxicillin, but returned last Thursday No pain with chewing, no teeth sensitivity. He had an implant for dentures inserted several years ago. He has not had any imaging for several years or at onset of current symptoms  Reviewed medical, surgical, and social history today  Medications: Outpatient Medications Prior to Visit  Medication Sig   acetaminophen (TYLENOL) 650 MG CR tablet Take 650 mg by mouth every 8 (eight) hours as needed for pain.   meloxicam  (MOBIC ) 15 MG tablet Take 1 tablet (15 mg total) by mouth daily as needed.   pantoprazole  (PROTONIX ) 40 MG tablet TAKE 1 TABLET BY MOUTH EVERY DAY   atorvastatin  (LIPITOR) 10 MG tablet TAKE 1 TABLET BY MOUTH EVERY DAY (Patient not taking: Reported on 06/04/2024)   meclizine  (ANTIVERT ) 25 MG tablet Take 1 tablet (25 mg total) by mouth 3 (three) times daily as needed for dizziness. (Patient not taking: Reported on 06/04/2024)   No facility-administered medications prior to visit.   Reviewed past medical and social history.   ROS per HPI above      Objective:  BP 124/78 (BP Location: Left Arm, Patient Position: Sitting, Cuff Size: Normal)   Pulse (!) 53   Temp 98.7 F (37.1 C) (Oral)   Wt 175 lb (79.4 kg)   SpO2 97%   BMI 29.12 kg/m      Physical Exam HENT:     Head: Normocephalic.     Jaw: Tenderness present. No trismus, swelling, pain on movement or malocclusion.     Salivary Glands: Right salivary gland is not diffusely enlarged or tender. Left salivary gland is  not diffusely enlarged or tender.     Mouth/Throat:     Mouth: Mucous membranes are moist.     Dentition: Abnormal dentition. Has dentures. Gingival swelling present. No dental caries or gum lesions.     Tongue: No lesions. Tongue does not deviate from midline.      Comments: No cervical or submandibular or submental lymphadenopathy. Has several missing teeth. Right and left lower anterior submental implants. Mild Left anterior gum erythema, left anterior submental tenderness, no jaw swelling noted, normal skin. Musculoskeletal:     Cervical back: Normal range of motion and neck supple.  Lymphadenopathy:     Cervical: No cervical adenopathy.     No results found for any visits on 06/04/24.    Assessment & Plan:    Problem List Items Addressed This Visit   None Visit Diagnoses       Presence of tooth-root and mandibular implants    -  Primary   Relevant Medications   clindamycin  (CLEOCIN ) 150 MG capsule   chlorhexidine  (PERIDEX ) 0.12 % solution     Pain in lower jaw       Relevant Medications   clindamycin  (CLEOCIN ) 150 MG capsule   chlorhexidine  (PERIDEX ) 0.12 % solution     Advised to schedule f/up appointment with prosthodontist to re evaluate dental implant.  Return if symptoms  worsen or fail to improve.     Roselie Mood, NP

## 2024-06-04 NOTE — Telephone Encounter (Signed)
 Informed Gary Case about the need for dental implant re evaluation with current symptoms. Advised to discuss need for dental imaging. He stated his complaint is related to soft tissue and not his dental implant or bone. I advised him that I do not agree with that conclusion. I again advised to schedule an appointment with his prosthodontist. Gary Case stated he will like to talk to his pcp: Dr. Berneta.

## 2024-06-04 NOTE — Telephone Encounter (Signed)
 Patient returned call to the office. I informed him of Charlotte's recommendations. Patient stated that he spoke with the prosthodontist and was informed that it is not a bone issue that it is a soft tissue issue and that Roselie should order the xray's needed for the soft tissue issue. I again reiterated Charlotte's comments/Recommendations to patient. He stated that he would like to speak with Roselie about this and that the xray's nee to be order. I informed him that Roselie will have to give him a call later if not her that I will be the one to give him a call with further comments/recommendations from [place].

## 2024-06-04 NOTE — Telephone Encounter (Signed)
 FYI Only or Action Required?: FYI only for provider.  Patient was last seen in primary care on 04/04/2024 by Berneta Elsie Sayre, MD.  Called Nurse Triage reporting Jaw Pain.  Symptoms began several days ago.  Interventions attempted: Rest, hydration, or home remedies.  Symptoms are: unchanged.  Triage Disposition: See HCP Within 4 Hours (Or PCP Triage)  Patient/caregiver understands and will follow disposition?: Yes  Copied from CRM [id number]. Topic: Clinical - Red Word Triage >> Jun 04, 2024  9:05 AM Mesmerise C wrote: [facility] that prompted transfer to Nurse Triage: Repeating infection in jaw went to dentist states they don't know told to contact pcp, states there's swelling and pain since last Thursday Reason for Disposition  Face is very swollen  Answer Assessment - Initial Assessment Questions 1. LOCATION: Which tooth is hurting?  (e.g., right-side/left-side, upper/lower, front/back)     Lower left side of jaw 2. ONSET: When did the toothache start?  (e.g., hours, days)      Started up again in the last week 3. SEVERITY: How bad is the toothache?  (Scale 1-10; mild, moderate or severe)     2 out of 10 4. SWELLING: Is there any visible swelling of your face?     yes 5. OTHER SYMPTOMS: Do you have any other symptoms? (e.g., fever)     No other symptoms  Patient reports he has been having episodes of inflammation to his jaw that has been coming and going. Patient states his dentist has been managing this-usually with antibiotics the inflammation will go away. Patient reports last episode was two weeks ago-had antibiotics and the symptoms went away. Patient reports that his inflammation has come back again and his dentist told him to follow up with PCP. Patient requested to see a provider earlier than PCP availability today. Appointment scheduled with another provider in office for 1:20 PM  Protocols used: [facility]

## 2024-06-04 NOTE — Telephone Encounter (Signed)
 LVM to CB (Mr. Tangeman) He left a note stating he talked to his prosthodontist after today's office visit. He was informed that office is unable to perform any imaging for soft tissue. He stated his problem is soft tissue related and not bone related. He stated I am welcomed to contact his prosthodontist: Dr. Lenette in [place], [place].

## 2024-06-04 NOTE — Patient Instructions (Addendum)
 Schedule appointment with an endodontist as soon as possible. Start clindamycin  and mouth rinse as prescribed

## 2024-06-25 ENCOUNTER — Other Ambulatory Visit: Payer: Self-pay | Admitting: Family Medicine

## 2024-06-25 ENCOUNTER — Other Ambulatory Visit: Payer: Self-pay | Admitting: Nurse Practitioner

## 2024-06-25 DIAGNOSIS — K219 Gastro-esophageal reflux disease without esophagitis: Secondary | ICD-10-CM

## 2024-06-25 DIAGNOSIS — Z965 Presence of tooth-root and mandibular implants: Secondary | ICD-10-CM

## 2024-06-25 DIAGNOSIS — R6884 Jaw pain: Secondary | ICD-10-CM

## 2024-06-25 DIAGNOSIS — K2900 Acute gastritis without bleeding: Secondary | ICD-10-CM

## 2024-06-25 DIAGNOSIS — Z9189 Other specified personal risk factors, not elsewhere classified: Secondary | ICD-10-CM

## 2024-08-30 ENCOUNTER — Other Ambulatory Visit: Payer: Self-pay | Admitting: Family Medicine

## 2024-08-30 DIAGNOSIS — Z9189 Other specified personal risk factors, not elsewhere classified: Secondary | ICD-10-CM

## 2024-09-06 ENCOUNTER — Other Ambulatory Visit: Payer: Self-pay

## 2024-09-06 DIAGNOSIS — K2900 Acute gastritis without bleeding: Secondary | ICD-10-CM

## 2024-09-06 DIAGNOSIS — K219 Gastro-esophageal reflux disease without esophagitis: Secondary | ICD-10-CM

## 2024-09-06 MED ORDER — PANTOPRAZOLE SODIUM 40 MG PO TBEC: 40.0000 mg | DELAYED_RELEASE_TABLET | Freq: Every day | ORAL | 3 refills | Status: AC

## 2024-09-06 NOTE — Telephone Encounter (Unsigned)
 Copied from CRM [id number]. Topic: Clinical - Medication Question >> Sep 06, 2024 11:34 AM Burnard DEL wrote: Reason for CRM: Patient called in stating that he would  like to know his refill request for pantoprazole  (PROTONIX ) 40 MG tablet was denied.Patient stated that he has no refills left at pharmacy and he doesn't think that it is too soon for it to be filled.Patient stated that he has been without the medication for a week now.

## 2024-09-21 ENCOUNTER — Other Ambulatory Visit: Payer: Self-pay | Admitting: Family Medicine

## 2024-09-21 DIAGNOSIS — M130 Polyarthritis, unspecified: Secondary | ICD-10-CM

## 2024-12-03 ENCOUNTER — Other Ambulatory Visit: Payer: Self-pay | Admitting: Family Medicine

## 2024-12-03 DIAGNOSIS — Z9189 Other specified personal risk factors, not elsewhere classified: Secondary | ICD-10-CM
